# Patient Record
Sex: Male | Born: 1970 | Race: White | Hispanic: No | Marital: Married | Smoking: Never smoker
Health system: Southern US, Community
[De-identification: ages and names within clinical notes are randomized; demographics above are authoritative.]

## PROBLEM LIST (undated history)

## (undated) DIAGNOSIS — K219 Gastro-esophageal reflux disease without esophagitis: Secondary | ICD-10-CM

---

## 1998-07-06 HISTORY — PX: OTHER SURGICAL HISTORY: SHX169

## 1998-11-24 ENCOUNTER — Other Ambulatory Visit: Admission: RE | Admit: 1998-11-24 | Discharge: 1998-11-24 | Payer: Self-pay | Admitting: Urology

## 2000-09-13 ENCOUNTER — Ambulatory Visit (HOSPITAL_COMMUNITY): Admission: RE | Admit: 2000-09-13 | Discharge: 2000-09-13 | Payer: Self-pay | Admitting: *Deleted

## 2000-09-13 ENCOUNTER — Encounter (INDEPENDENT_AMBULATORY_CARE_PROVIDER_SITE_OTHER): Payer: Self-pay

## 2003-03-27 ENCOUNTER — Encounter: Payer: Self-pay | Admitting: Emergency Medicine

## 2003-03-27 ENCOUNTER — Emergency Department (HOSPITAL_COMMUNITY): Admission: EM | Admit: 2003-03-27 | Discharge: 2003-03-27 | Payer: Self-pay | Admitting: Emergency Medicine

## 2004-02-12 ENCOUNTER — Emergency Department (HOSPITAL_COMMUNITY): Admission: EM | Admit: 2004-02-12 | Discharge: 2004-02-12 | Payer: Self-pay | Admitting: Emergency Medicine

## 2008-06-07 ENCOUNTER — Ambulatory Visit: Payer: Self-pay | Admitting: Cardiology

## 2008-11-09 ENCOUNTER — Encounter: Admission: RE | Admit: 2008-11-09 | Discharge: 2008-11-09 | Payer: Self-pay | Admitting: Orthopedic Surgery

## 2010-08-06 HISTORY — PX: PLANTAR FASCIECTOMY: SUR600

## 2010-12-22 NOTE — Procedures (Signed)
[facility]  Patient:    Jeffrey Padilla, Jeffrey Padilla                    MRN: [id number] Proc. Date: 09/13/00 Adm. Date:  [id number] Attending:  Sabino Gasser CC:         Dr. Audie Pinto   Procedure Report  PROCEDURE:  Upper Endoscopy with Savary dilation and biopsy.  GASTROENTEROLOGIST:  Sabino Gasser, M.D.  INDICATIONS:  Dysphagia, reflux symptomatology.  ANESTHESIA:  Demerol 100 mg, Versed 10 mg.  PROCEDURE IN DETAIL:  With the patient mildly sedated and in the left lateral decubitus position, the Olympus video endoscope was inserted in the mouth, passed under direct vision through the esophagus which appeared inflamed distally, and there was a question of short segment Barretts esophagus.  We photographed this.  We entered into the stomach.  Fundus body appeared normal.  The antrum, however, was pock marked by multiple small punctate ulcerations which were photographed and subsequently biopsied.  We entered the pylorus and visualized the duodenum and second portion of the duodenum, both of which appeared normal and were photographed.  From this point, the endoscope was slowly withdrawn, taking circumferential views of entire duodenal mucosa until the endoscope had been pulled back into the stomach, placed in retroflexion to view the stomach from below.  The endoscope was then straightened, and a guidewire was passed.  The endoscope was removed subsequently over the guidewire.  Savary 18 and 20 dilators were passed with some resistance.  With the latter, the endoscope was then reinserted to the antrum and then slowly withdrawn, taking circumferential views of the entire gastric mucosa until we pulled back to the distal esophagus where we took a biopsy to look for the short segment Barretts esophagus.  The endoscope was then withdrawn, taking circumferential views of remaining esophageal mucosa.  The patients vital signs and pulse oximetry remained stable.   The patient tolerated the procedure well with no apparent complications.  FINDINGS:  Inflammatory changes of distal esophagus may be causing the patients dysphagia.  We dilated this to 18 and 20 Savary dilators. There was a question of short segment Barretts esophagus here which was biopsied, and multiple punctate ulcerations of the antrum which were also biopsied.  PLAN:  Continue patient on proton pump inhibitor therapy.  We will have him back for followup to assess clinical response.  The patient will all me for results of biopsy next week.  We will proceed to colonoscopy as planned. DD:  09/13/00 TD:  09/14/00 Job: [id number]

## 2010-12-22 NOTE — Procedures (Signed)
[facility]  Patient:    Jeffrey Padilla, Jeffrey Padilla                    MRN: [id number] Proc. Date: 09/13/00 Adm. Date:  [id number] Attending:  Sabino Gasser                           Procedure Report  PROCEDURE:  Colonoscopy.  SURGEON:  Sabino Gasser, M.D.  INDICATIONS:  Rectal bleeding, change in bowel habits with new onset of severe constipation.  ANESTHESIA:  None further given.  DESCRIPTION OF PROCEDURE:  With the patient mildly sedated in the left lateral decubitus position, and suddenly rolled to his back, first a rectal examination was performed which was unremarkable.  Subsequently, the Olympus videoscopic colonoscope was inserted in the rectum and passed under direct vision to the cecum.  The cecum was identified by the ileocecal valve and appendiceal orifice, both of which were photographed.  From this point, the colonoscope was slowly withdrawn, taking circumferential views of the entire colonic mucosa, stopping only in the rectum which appeared normal on direct view, and showed internal hemorrhoids on retroflexed view.  The endoscope was then straightened and withdrawn.  The patients vital signs and pulse oximeter remained stable.  The patient tolerated the procedure well and without apparent complications.  FINDINGS:  Internal hemorrhoids, otherwise unremarkable colonoscopic examination to the cecum.  PLAN:  No further evaluation necessary at this point.  Will increase the patients fiber intake and fluid intake to help control his constipation symptoms.  See endoscopy note for further details of followup. DD:  09/13/00 TD:  09/14/00 Job: [id number]

## 2010-12-22 NOTE — Procedures (Signed)
[facility]  Patient:    Jeffrey Padilla, Jeffrey Padilla                    MRN: [id number] Proc. Date: 09/13/00 Adm. Date:  [id number] Attending:  Sabino Gasser CC:         Dr. Audie Pinto   Procedure Report  ADDENDUN TO COLONOSCOPY REPORT: DD:  09/13/00 TD:  09/14/00 Job: [id number]

## 2011-06-01 ENCOUNTER — Emergency Department (HOSPITAL_COMMUNITY): Payer: PRIVATE HEALTH INSURANCE

## 2011-06-01 ENCOUNTER — Observation Stay (HOSPITAL_COMMUNITY): Payer: PRIVATE HEALTH INSURANCE

## 2011-06-01 ENCOUNTER — Encounter (HOSPITAL_COMMUNITY): Payer: Self-pay | Admitting: Radiology

## 2011-06-01 ENCOUNTER — Observation Stay (HOSPITAL_COMMUNITY)
Admission: EM | Admit: 2011-06-01 | Discharge: 2011-06-01 | Disposition: A | Discharge status: Home / Self Care | Payer: PRIVATE HEALTH INSURANCE | Attending: Emergency Medicine | Admitting: Emergency Medicine

## 2011-06-01 DIAGNOSIS — J189 Pneumonia, unspecified organism: Secondary | ICD-10-CM | POA: Insufficient documentation

## 2011-06-01 DIAGNOSIS — R942 Abnormal results of pulmonary function studies: Secondary | ICD-10-CM | POA: Insufficient documentation

## 2011-06-01 DIAGNOSIS — M545 Low back pain, unspecified: Principal | ICD-10-CM | POA: Insufficient documentation

## 2011-06-01 HISTORY — DX: Gastro-esophageal reflux disease without esophagitis: K21.9

## 2011-06-01 LAB — POCT I-STAT, CHEM 8
BUN: 12 mg/dL (ref 6–23)
Calcium, Ion: 1.14 mmol/L (ref 1.12–1.32)
Chloride: 105 mEq/L (ref 96–112)
Creatinine, Ser: 0.7 mg/dL (ref 0.50–1.35)
Glucose, Bld: 98 mg/dL (ref 70–99)
HCT: 47 % (ref 39.0–52.0)
Hemoglobin: 16 g/dL (ref 13.0–17.0)
Potassium: 3.4 mEq/L — ABNORMAL LOW (ref 3.5–5.1)
Sodium: 141 mEq/L (ref 135–145)
TCO2: 23 mmol/L (ref 0–100)

## 2011-06-01 LAB — URINALYSIS, ROUTINE W REFLEX MICROSCOPIC
Bilirubin Urine: NEGATIVE
Glucose, UA: NEGATIVE mg/dL
Hgb urine dipstick: NEGATIVE
Ketones, ur: NEGATIVE mg/dL
Leukocytes, UA: NEGATIVE
Nitrite: NEGATIVE
Protein, ur: NEGATIVE mg/dL
Specific Gravity, Urine: 1.02 (ref 1.005–1.030)
Urobilinogen, UA: 0.2 mg/dL (ref 0.0–1.0)
pH: 8 (ref 5.0–8.0)

## 2011-06-01 MED ORDER — IOHEXOL 350 MG/ML SOLN: 80.0000 mL | Freq: Once | INTRAVENOUS | Status: DC | PRN

## 2011-06-08 ENCOUNTER — Ambulatory Visit (INDEPENDENT_AMBULATORY_CARE_PROVIDER_SITE_OTHER): Payer: PRIVATE HEALTH INSURANCE | Admitting: General Surgery

## 2011-06-08 ENCOUNTER — Encounter (INDEPENDENT_AMBULATORY_CARE_PROVIDER_SITE_OTHER): Payer: Self-pay | Admitting: General Surgery

## 2011-06-08 VITALS — BP 180/90 | HR 72 | Temp 97.4°F | Resp 20 | Ht 72.0 in | Wt 296.0 lb

## 2011-06-08 DIAGNOSIS — R109 Unspecified abdominal pain: Secondary | ICD-10-CM

## 2011-06-08 NOTE — Progress Notes (Signed)
Subjective:   Right flank pain  Patient ID: Jeffrey Padilla, male   DOB: August 27, 1970, 40 y.o.   MRN: [id number]  HPI Patient is a 40 year old male referred from [facility] orthopedics by Dr. Andrey Farmer. He is here accompanied by his wife. Approximately 8-10 weeks ago he had the onset over a period of days of pain in his right flank. There was no recognizable injury or inciting factor. He states that he developed a pressure and cramping type of pain which he locates at his costal margin somewhat posterior to the midaxillary line. He states the pain gradually increased in intensity and has become intermittently quite severe. He has been evaluated several times by his PCP as well as orthopedics. He tried to remain active despite the pain and last week he made a sudden move to catch a heavy object and felt like the pressure-like pain "burst" but then the cramping pain became much more severe and intolerable. He presented to the emergency room at [facility]. Complete lab work and CT scan of the abdomen and pelvis as well as CT angiogram chest was performed. I have reviewed this study. It shows some possible scar or atelectasis in the right lung base and followup has been arranged for this. There was a small nonobstructing stone within the right kidney but no other abnormalities. Urinalysis and lab work were unremarkable.  The patient has been taking Percocet with some relief. The pain is worse with physical activity and improved with rest and lying on his back. He had some nausea with the Percocet but otherwise no GI symptoms, fever or chills, or any other associated symptoms. He was evaluated by orthopedics and an MRI was performed. I do not have the results of this as I was unable to open the disc but he was not felt to have a back problem and was referred for possible hernia or abdominal muscle tear. He states he has noted some swelling in the area of the pain but not a distinct bulge. He does not have any  history of any similar problems.  Past Medical History  Diagnosis Date  . Acid reflux   . Abdominal pain    Past Surgical History  Procedure Date  . 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 Diazepam (VALIUM PO) Take by mouth every 4 (four) hours as needed.        . Esomeprazole Magnesium (NEXIUM PO) Take by mouth daily.        . Oxycodone-Acetaminophen (PERCOCET PO) Take by mouth every 4 (four) hours as needed.        . Allergies  Allergen Reactions  . Morphine And Related   . Penicillins    History  Substance Use Topics  . Smoking status: Not on file  . Smokeless tobacco: Never Used  . Alcohol Use: No     Review of Systems  Constitutional: Negative for fever and chills.  Respiratory: Negative for cough, shortness of breath and wheezing.   Cardiovascular: Negative for chest pain and palpitations.  Gastrointestinal: Negative for nausea, vomiting, diarrhea, constipation and abdominal distention.  Genitourinary: Negative for dysuria, frequency and difficulty urinating.       Objective:   Physical Exam General: Obese but otherwise healthy appearing white male who is uncomfortable but in no acute distress HEENT: No palpable masses or thyromegaly. Sclera nonicteric. Skin: No rash or infection Lungs: Clear bilaterally with no increased work  of breathing Cardiovascular: Regular rate and rhythm without murmur no edema Abdomen: Soft and nontender with no palpable masses or organomegaly. There is not really any tenderness over the right posterior flank in the area of his pain. He feels there is swelling there I cannot detect any distinct swelling. It is certainly not feel any mass or bulge or any evidence of hernia. Extremities: No swelling Neurologic: Alert and fully oriented. Gait normal.    Assessment:     Persistent right posterior flank pain. The patient was referred for possible hernia.  This is somewhat in the area of a lumbar hernia which of course would be quite rare but I find no evidence of hernia by physical exam or imaging studies. I do not see any evidence of intra-abdominal source for his pain and clinically it seems to be musculoskeletal. I suspect this is most likely an abdominal or flank muscle strain or tear. I do not find any evidence of an acute surgical problem.    Plan:     I discussed with the patient and his wife and I did not see any surgical indication. He has had a thorough workup and I do not have any additional suggestions. I think by exclusion that an abdominal muscle injury but no hernia is most likely. Unfortunately have no other suggestions than rest and analgesics. Possibly some physical therapy could help and I suggested he asked orthopedics about this. I told him I would be happy to reevaluate him at any time if any other symptoms or findings develop.

## 2011-06-13 ENCOUNTER — Ambulatory Visit (INDEPENDENT_AMBULATORY_CARE_PROVIDER_SITE_OTHER): Payer: Self-pay | Admitting: General Surgery

## 2011-08-22 ENCOUNTER — Telehealth (INDEPENDENT_AMBULATORY_CARE_PROVIDER_SITE_OTHER): Payer: Self-pay

## 2011-09-06 NOTE — Telephone Encounter (Signed)
Patient appt 06/08/11 w/Dr. Johna Sheriff

## 2011-10-15 ENCOUNTER — Telehealth (INDEPENDENT_AMBULATORY_CARE_PROVIDER_SITE_OTHER): Payer: Self-pay

## 2011-10-15 NOTE — Telephone Encounter (Signed)
Error

## 2016-06-04 ENCOUNTER — Other Ambulatory Visit: Payer: Self-pay | Admitting: Occupational Medicine

## 2016-06-04 ENCOUNTER — Ambulatory Visit: Payer: Self-pay

## 2016-06-04 DIAGNOSIS — M79642 Pain in left hand: Secondary | ICD-10-CM

## 2017-03-11 NOTE — H&P (Signed)
Jeffrey Padilla comes in for his left heel.  This has gotten to a point of being completely intolerable.  This is the same side where he had a plantar fascial release and a gastroc resection done a number of years ago.  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  He recently had to take a new fitness test which involved walking and running on a treadmill at heights and this is really making his heel get aggravated.  He has altered his gait and he is now starting to get shin splints.  He comes in to discuss definitive treatment.  No new trauma.   History, workup and treatment to date are reviewed.   EXAMINATION: General exam is outlined and included in the chart.  Specifically, he has point tenderness distal Achilles with prominent spurring there, as well as over Haglund deformity.  No tenderness in the plantar fascia.  Because of his gastroc release his dorsiflexion is really quite good, more than 15 degrees even with the knee extended.  AP and lateral x-ray shows marked spurring and fragmentation at the Achilles attachment at the heel.  Haglund deformity.  There is a plantar fascia spur, but that is asymptomatic.    DISPOSITION:  We have discussed definitive treatment.  Nothing short of operative intervention is going to help and he understands that.  Taken down his Achilles and remove all of the spurs and loose bodies.  Take down the Haglund deformity and then primary repair of his Achilles.  Procedure, risks, benefits and complications reviewed.  Based on his job, out of work at least 12 weeks before he is sufficiently rehabbed and recovered.  It could be a little longer than that.  He understands that.  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  I filled out all of his  paperwork.  All questions answered.  See him at the time of operative intervention.

## 2017-03-14 ENCOUNTER — Encounter (HOSPITAL_BASED_OUTPATIENT_CLINIC_OR_DEPARTMENT_OTHER): Payer: Self-pay | Admitting: *Deleted

## 2017-03-21 ENCOUNTER — Encounter (HOSPITAL_BASED_OUTPATIENT_CLINIC_OR_DEPARTMENT_OTHER): Payer: Self-pay | Admitting: Certified Registered"

## 2017-03-21 ENCOUNTER — Encounter (HOSPITAL_BASED_OUTPATIENT_CLINIC_OR_DEPARTMENT_OTHER)
Admission: RE | Disposition: A | Discharge status: Home / Self Care | Payer: Self-pay | Source: Ambulatory Visit | Attending: Orthopedic Surgery

## 2017-03-21 ENCOUNTER — Ambulatory Visit (HOSPITAL_BASED_OUTPATIENT_CLINIC_OR_DEPARTMENT_OTHER)
Admission: RE | Admit: 2017-03-21 | Discharge: 2017-03-21 | Disposition: A | Discharge status: Home / Self Care | Payer: PRIVATE HEALTH INSURANCE | Source: Ambulatory Visit | Attending: Orthopedic Surgery | Admitting: Orthopedic Surgery

## 2017-03-21 ENCOUNTER — Ambulatory Visit (HOSPITAL_BASED_OUTPATIENT_CLINIC_OR_DEPARTMENT_OTHER): Payer: PRIVATE HEALTH INSURANCE | Admitting: Certified Registered"

## 2017-03-21 DIAGNOSIS — M9262 Juvenile osteochondrosis of tarsus, left ankle: Secondary | ICD-10-CM | POA: Diagnosis present

## 2017-03-21 DIAGNOSIS — Z79891 Long term (current) use of opiate analgesic: Secondary | ICD-10-CM | POA: Diagnosis not present

## 2017-03-21 DIAGNOSIS — M7660 Achilles tendinitis, unspecified leg: Secondary | ICD-10-CM | POA: Insufficient documentation

## 2017-03-21 DIAGNOSIS — Z79899 Other long term (current) drug therapy: Secondary | ICD-10-CM | POA: Diagnosis not present

## 2017-03-21 DIAGNOSIS — M65872 Other synovitis and tenosynovitis, left ankle and foot: Secondary | ICD-10-CM | POA: Diagnosis not present

## 2017-03-21 HISTORY — PX: EXCISION HAGLUND'S DEFORMITY WITH ACHILLES TENDON REPAIR: SHX5627

## 2017-03-21 SURGERY — EXCISION HAGLUND'S DEFORMITY WITH ACHILLES TENDON REPAIR
Anesthesia: General | Site: Foot | Laterality: Left

## 2017-03-21 MED ORDER — FENTANYL CITRATE (PF) 100 MCG/2ML IJ SOLN
50.0000 ug | INTRAMUSCULAR | Status: DC | PRN
  Administered 2017-03-21: 50 ug via INTRAVENOUS
  Administered 2017-03-21: 100 ug via INTRAVENOUS

## 2017-03-21 MED ORDER — ONDANSETRON HCL 4 MG/2ML IJ SOLN
INTRAMUSCULAR | Status: DC | PRN
  Administered 2017-03-21: 4 mg via INTRAVENOUS

## 2017-03-21 MED ORDER — LACTATED RINGERS IV SOLN
INTRAVENOUS | Status: DC
  Administered 2017-03-21 (×2): via INTRAVENOUS

## 2017-03-21 MED ORDER — PROPOFOL 10 MG/ML IV BOLUS
INTRAVENOUS | Status: AC
  Filled 2017-03-21: qty 20

## 2017-03-21 MED ORDER — PROPOFOL 10 MG/ML IV BOLUS
INTRAVENOUS | Status: DC | PRN
  Administered 2017-03-21: 200 mg via INTRAVENOUS

## 2017-03-21 MED ORDER — GABAPENTIN 300 MG PO CAPS
300.0000 mg | ORAL_CAPSULE | Freq: Once | ORAL | Status: AC
  Administered 2017-03-21: 300 mg via ORAL

## 2017-03-21 MED ORDER — PROMETHAZINE HCL 25 MG/ML IJ SOLN: 6.2500 mg | INTRAMUSCULAR | Status: DC | PRN

## 2017-03-21 MED ORDER — ONDANSETRON HCL 4 MG PO TABS: 4.0000 mg | ORAL_TABLET | Freq: Three times a day (TID) | ORAL | 0 refills | Status: DC | PRN

## 2017-03-21 MED ORDER — CELECOXIB 400 MG PO CAPS
400.0000 mg | ORAL_CAPSULE | Freq: Once | ORAL | Status: AC
  Administered 2017-03-21: 400 mg via ORAL

## 2017-03-21 MED ORDER — VANCOMYCIN HCL IN DEXTROSE 1-5 GM/200ML-% IV SOLN
1000.0000 mg | INTRAVENOUS | Status: AC
  Administered 2017-03-21: 1000 mg via INTRAVENOUS

## 2017-03-21 MED ORDER — ONDANSETRON HCL 4 MG PO TABS: 4.0000 mg | ORAL_TABLET | Freq: Four times a day (QID) | ORAL | Status: DC | PRN

## 2017-03-21 MED ORDER — GABAPENTIN 300 MG PO CAPS
ORAL_CAPSULE | ORAL | Status: AC
  Filled 2017-03-21: qty 1

## 2017-03-21 MED ORDER — LACTATED RINGERS IV SOLN: INTRAVENOUS | Status: DC

## 2017-03-21 MED ORDER — MIDAZOLAM HCL 2 MG/2ML IJ SOLN
1.0000 mg | INTRAMUSCULAR | Status: DC | PRN
  Administered 2017-03-21: 2 mg via INTRAVENOUS

## 2017-03-21 MED ORDER — FENTANYL CITRATE (PF) 100 MCG/2ML IJ SOLN: 25.0000 ug | INTRAMUSCULAR | Status: DC | PRN

## 2017-03-21 MED ORDER — ACETAMINOPHEN 500 MG PO TABS
1000.0000 mg | ORAL_TABLET | Freq: Once | ORAL | Status: AC
  Administered 2017-03-21: 1000 mg via ORAL

## 2017-03-21 MED ORDER — OXYCODONE-ACETAMINOPHEN 5-325 MG PO TABS: 1.0000 | ORAL_TABLET | ORAL | 0 refills | Status: DC | PRN

## 2017-03-21 MED ORDER — 0.9 % SODIUM CHLORIDE (POUR BTL) OPTIME
TOPICAL | Status: DC | PRN
  Administered 2017-03-21: 250 mL

## 2017-03-21 MED ORDER — SUCCINYLCHOLINE CHLORIDE 20 MG/ML IJ SOLN
INTRAMUSCULAR | Status: DC | PRN
  Administered 2017-03-21: 120 mg via INTRAVENOUS

## 2017-03-21 MED ORDER — FENTANYL CITRATE (PF) 100 MCG/2ML IJ SOLN
INTRAMUSCULAR | Status: AC
  Filled 2017-03-21: qty 2

## 2017-03-21 MED ORDER — CELECOXIB 200 MG PO CAPS
ORAL_CAPSULE | ORAL | Status: AC
  Filled 2017-03-21: qty 2

## 2017-03-21 MED ORDER — ONDANSETRON HCL 4 MG/2ML IJ SOLN
INTRAMUSCULAR | Status: AC
  Filled 2017-03-21: qty 18

## 2017-03-21 MED ORDER — OXYCODONE-ACETAMINOPHEN 5-325 MG PO TABS: 1.0000 | ORAL_TABLET | ORAL | Status: DC | PRN

## 2017-03-21 MED ORDER — METOCLOPRAMIDE HCL 5 MG PO TABS: 5.0000 mg | ORAL_TABLET | Freq: Three times a day (TID) | ORAL | Status: DC | PRN

## 2017-03-21 MED ORDER — BUPIVACAINE-EPINEPHRINE (PF) 0.5% -1:200000 IJ SOLN
INTRAMUSCULAR | Status: AC
  Filled 2017-03-21: qty 30

## 2017-03-21 MED ORDER — DEXAMETHASONE SODIUM PHOSPHATE 10 MG/ML IJ SOLN
INTRAMUSCULAR | Status: AC
  Filled 2017-03-21: qty 4

## 2017-03-21 MED ORDER — LIDOCAINE HCL (CARDIAC) 20 MG/ML IV SOLN
INTRAVENOUS | Status: DC | PRN
  Administered 2017-03-21: 30 mg via INTRAVENOUS

## 2017-03-21 MED ORDER — LIDOCAINE 2% (20 MG/ML) 5 ML SYRINGE
INTRAMUSCULAR | Status: AC
  Filled 2017-03-21: qty 25

## 2017-03-21 MED ORDER — DEXAMETHASONE SODIUM PHOSPHATE 4 MG/ML IJ SOLN
INTRAMUSCULAR | Status: DC | PRN
  Administered 2017-03-21: 10 mg via INTRAVENOUS

## 2017-03-21 MED ORDER — ACETAMINOPHEN 500 MG PO TABS
ORAL_TABLET | ORAL | Status: AC
  Filled 2017-03-21: qty 2

## 2017-03-21 MED ORDER — BUPIVACAINE-EPINEPHRINE (PF) 0.5% -1:200000 IJ SOLN
INTRAMUSCULAR | Status: DC | PRN
  Administered 2017-03-21: 30 mL via PERINEURAL

## 2017-03-21 MED ORDER — MIDAZOLAM HCL 2 MG/2ML IJ SOLN
INTRAMUSCULAR | Status: AC
  Filled 2017-03-21: qty 2

## 2017-03-21 MED ORDER — CHLORHEXIDINE GLUCONATE 4 % EX LIQD: 60.0000 mL | Freq: Once | CUTANEOUS | Status: DC

## 2017-03-21 MED ORDER — SCOPOLAMINE 1 MG/3DAYS TD PT72: 1.0000 | MEDICATED_PATCH | Freq: Once | TRANSDERMAL | Status: DC | PRN

## 2017-03-21 MED ORDER — HYDROMORPHONE HCL 1 MG/ML IJ SOLN: 0.5000 mg | INTRAMUSCULAR | Status: DC | PRN

## 2017-03-21 MED ORDER — ONDANSETRON HCL 4 MG/2ML IJ SOLN: 4.0000 mg | Freq: Four times a day (QID) | INTRAMUSCULAR | Status: DC | PRN

## 2017-03-21 MED ORDER — VANCOMYCIN HCL IN DEXTROSE 1-5 GM/200ML-% IV SOLN
INTRAVENOUS | Status: AC
  Filled 2017-03-21: qty 400

## 2017-03-21 MED ORDER — PROPOFOL 500 MG/50ML IV EMUL
INTRAVENOUS | Status: AC
  Filled 2017-03-21: qty 150

## 2017-03-21 MED ORDER — CELECOXIB 200 MG PO CAPS: 200.0000 mg | ORAL_CAPSULE | Freq: Once | ORAL | Status: DC

## 2017-03-21 MED ORDER — METOCLOPRAMIDE HCL 5 MG/ML IJ SOLN: 5.0000 mg | Freq: Three times a day (TID) | INTRAMUSCULAR | Status: DC | PRN

## 2017-03-21 SURGICAL SUPPLY — 80 items
BANDAGE ACE 4X5 VEL STRL LF (GAUZE/BANDAGES/DRESSINGS) ×4 IMPLANT
BANDAGE ACE 6X5 VEL STRL LF (GAUZE/BANDAGES/DRESSINGS) ×4 IMPLANT
BANDAGE ESMARK 6X9 LF (GAUZE/BANDAGES/DRESSINGS) ×2 IMPLANT
BLADE AVERAGE 25MMX9MM (BLADE) ×1
BLADE AVERAGE 25X9 (BLADE) ×3 IMPLANT
BLADE MICRO SAGITTAL (BLADE) IMPLANT
BLADE SURG 15 STRL LF DISP TIS (BLADE) ×4 IMPLANT
BLADE SURG 15 STRL SS (BLADE) ×4
BNDG CMPR 9X6 STRL LF SNTH (GAUZE/BANDAGES/DRESSINGS) ×1
BNDG COHESIVE 4X5 TAN STRL (GAUZE/BANDAGES/DRESSINGS) ×4 IMPLANT
BNDG ESMARK 6X9 LF (GAUZE/BANDAGES/DRESSINGS) ×4
CANISTER SUCT 1200ML W/VALVE (MISCELLANEOUS) ×4 IMPLANT
COVER BACK TABLE 60X90IN (DRAPES) ×4 IMPLANT
CUFF TOURNIQUET SINGLE 34IN LL (TOURNIQUET CUFF) ×4 IMPLANT
CUFF TOURNIQUET SINGLE 44IN (TOURNIQUET CUFF) IMPLANT
DRAPE EXTREMITY T 121X128X90 (DRAPE) ×4 IMPLANT
DRAPE IMP U-DRAPE 54X76 (DRAPES) ×4 IMPLANT
DRAPE OEC MINIVIEW 54X84 (DRAPES) ×4 IMPLANT
DRAPE U-SHAPE 47X51 STRL (DRAPES) ×4 IMPLANT
DRSG PAD ABDOMINAL 8X10 ST (GAUZE/BANDAGES/DRESSINGS) ×4 IMPLANT
DURAPREP 26ML APPLICATOR (WOUND CARE) ×4 IMPLANT
ELECT REM PT RETURN 9FT ADLT (ELECTROSURGICAL) ×4
ELECTRODE REM PT RTRN 9FT ADLT (ELECTROSURGICAL) ×2 IMPLANT
GAUZE SPONGE 4X4 12PLY STRL (GAUZE/BANDAGES/DRESSINGS) ×4 IMPLANT
GAUZE XEROFORM 1X8 LF (GAUZE/BANDAGES/DRESSINGS) ×4 IMPLANT
GLOVE BIO SURGEON STRL SZ 6.5 (GLOVE) ×3 IMPLANT
GLOVE BIO SURGEONS STRL SZ 6.5 (GLOVE) ×1
GLOVE BIOGEL PI IND STRL 7.0 (GLOVE) ×6 IMPLANT
GLOVE BIOGEL PI INDICATOR 7.0 (GLOVE) ×6
GLOVE ECLIPSE 7.0 STRL STRAW (GLOVE) ×4 IMPLANT
GLOVE SURG ORTHO 8.0 STRL STRW (GLOVE) ×4 IMPLANT
GOWN STRL REUS W/ TWL LRG LVL3 (GOWN DISPOSABLE) ×2 IMPLANT
GOWN STRL REUS W/ TWL XL LVL3 (GOWN DISPOSABLE) ×4 IMPLANT
GOWN STRL REUS W/TWL LRG LVL3 (GOWN DISPOSABLE) ×3
GOWN STRL REUS W/TWL XL LVL3 (GOWN DISPOSABLE) ×8 IMPLANT
IMPL SYS BIOCOMP ACH SPEED (Anchor) ×2 IMPLANT
IMPLANT SYS BIOCOMP ACH SPEED (Anchor) ×4 IMPLANT
NDL SUT 6 .5 CRC .975X.05 MAYO (NEEDLE) ×2 IMPLANT
NEEDLE 1/2 CIR CATGUT .05X1.09 (NEEDLE) IMPLANT
NEEDLE HYPO 22GX1.5 SAFETY (NEEDLE) IMPLANT
NEEDLE MAYO TAPER (NEEDLE) ×2
PACK BASIN DAY SURGERY FS (CUSTOM PROCEDURE TRAY) ×4 IMPLANT
PAD CAST 4YDX4 CTTN HI CHSV (CAST SUPPLIES) ×4 IMPLANT
PADDING CAST ABS 4INX4YD NS (CAST SUPPLIES) ×6
PADDING CAST ABS COTTON 4X4 ST (CAST SUPPLIES) ×6 IMPLANT
PADDING CAST COTTON 4X4 STRL (CAST SUPPLIES) ×4
PADDING CAST COTTON 6X4 STRL (CAST SUPPLIES) ×4 IMPLANT
PENCIL BUTTON HOLSTER BLD 10FT (ELECTRODE) ×4 IMPLANT
SLEEVE SCD COMPRESS KNEE MED (MISCELLANEOUS) ×4 IMPLANT
SPLINT FAST PLASTER 5X30 (CAST SUPPLIES) ×40
SPLINT FIBERGLASS 4X30 (CAST SUPPLIES) IMPLANT
SPLINT PLASTER CAST FAST 5X30 (CAST SUPPLIES) ×40 IMPLANT
SPONGE LAP 4X18 X RAY DECT (DISPOSABLE) ×4 IMPLANT
STAPLER VISISTAT 35W (STAPLE) IMPLANT
STOCKINETTE 6  STRL (DRAPES) ×2
STOCKINETTE 6 STRL (DRAPES) ×2 IMPLANT
SUCTION FRAZIER HANDLE 10FR (MISCELLANEOUS) ×2
SUCTION TUBE FRAZIER 10FR DISP (MISCELLANEOUS) ×2 IMPLANT
SUT ETHILON 2 0 FS 18 (SUTURE) IMPLANT
SUT ETHILON 3 0 PS 1 (SUTURE) IMPLANT
SUT FIBERWIRE #2 38 T-5 BLUE (SUTURE)
SUT VIC AB 0 CT1 27 (SUTURE)
SUT VIC AB 0 CT1 27XBRD ANBCTR (SUTURE) IMPLANT
SUT VIC AB 1 CT1 27 (SUTURE)
SUT VIC AB 1 CT1 27XBRD ANBCTR (SUTURE) IMPLANT
SUT VIC AB 2-0 CT1 27 (SUTURE) ×6
SUT VIC AB 2-0 CT1 TAPERPNT 27 (SUTURE) ×4 IMPLANT
SUT VIC AB 2-0 SH 27 (SUTURE)
SUT VIC AB 2-0 SH 27XBRD (SUTURE) IMPLANT
SUT VIC AB 3-0 SH 27 (SUTURE)
SUT VIC AB 3-0 SH 27X BRD (SUTURE) IMPLANT
SUTURE FIBERWR #2 38 T-5 BLUE (SUTURE) IMPLANT
SYR BULB 3OZ (MISCELLANEOUS) ×4 IMPLANT
SYR CONTROL 10ML LL (SYRINGE) IMPLANT
TOWEL OR 17X24 6PK STRL BLUE (TOWEL DISPOSABLE) ×8 IMPLANT
TOWEL OR NON WOVEN STRL DISP B (DISPOSABLE) ×8 IMPLANT
TUBE CONNECTING 20'X1/4 (TUBING) ×1
TUBE CONNECTING 20X1/4 (TUBING) ×3 IMPLANT
UNDERPAD 30X30 (UNDERPADS AND DIAPERS) ×4 IMPLANT
YANKAUER SUCT BULB TIP NO VENT (SUCTIONS) IMPLANT

## 2017-03-21 NOTE — Transfer of Care (Signed)
Immediate Anesthesia Transfer of Care Note  Patient: Jeffrey FieldJeffery F Padilla  Procedure(s) Performed: Procedure(s): LEFT FOOT PARTIAL CALCANEUS EXCISION , LEFT ACHILLES TENDON REPAIR (Left)  Patient Location: PACU  Anesthesia Type:GA combined with regional for post-op pain  Level of Consciousness: awake and patient cooperative  Airway & Oxygen Therapy: Patient Spontanous Breathing and Patient connected to face mask oxygen  Post-op Assessment: Report given to RN and Post -op Vital signs reviewed and stable  Post vital signs: Reviewed and stable  Last Vitals:  Vitals:   03/21/17 0840 03/21/17 0845  BP:    Pulse: 74 69  Resp: 16 18  Temp:    SpO2: 98% 98%    Last Pain:  Vitals:   03/21/17 0757  TempSrc: Oral  PainSc: 5       Patients Stated Pain Goal: 2 (03/21/17 0757)  Complications: No apparent anesthesia complications

## 2017-03-21 NOTE — Anesthesia Procedure Notes (Signed)
Anesthesia Regional Block: Popliteal block   Pre-Anesthetic Checklist: ,, timeout performed, Correct Patient, Correct Site, Correct Laterality, Correct Procedure, Correct Position, site marked, Risks and benefits discussed,  Surgical consent,  Pre-op evaluation,  At surgeon's request and post-op pain management  Laterality: Left  Prep: chloraprep       Needles:  Injection technique: Single-shot  Needle Type: Echogenic Needle     Needle Length: 9cm  Needle Gauge: 21     Additional Needles:   Procedures: ultrasound guided,,,,,,,,  Narrative:  Start time: 03/21/2017 8:15 AM End time: 03/21/2017 8:25 AM Injection made incrementally with aspirations every 5 mL.  Performed by: Personally  Anesthesiologist: Cecile HearingURK, Jaylene Arrowood EDWARD  Additional Notes: No pain on injection. No increased resistance to injection. Injection made in 5cc increments.  Good needle visualization.  Patient tolerated procedure well.  Combined left popliteal/saphenous nerve block.

## 2017-03-21 NOTE — Anesthesia Postprocedure Evaluation (Signed)
Anesthesia Post Note  Patient: Delford FieldJeffery F Speyer  Procedure(s) Performed: Procedure(s) (LRB): LEFT FOOT PARTIAL CALCANEUS EXCISION , LEFT ACHILLES TENDON REPAIR (Left)     Patient location during evaluation: PACU Anesthesia Type: General Level of consciousness: awake and alert Pain management: pain level controlled Vital Signs Assessment: post-procedure vital signs reviewed and stable Respiratory status: spontaneous breathing, nonlabored ventilation and respiratory function stable Cardiovascular status: blood pressure returned to baseline and stable Postop Assessment: no signs of nausea or vomiting Anesthetic complications: no    Last Vitals:  Vitals:   03/21/17 1100 03/21/17 1125  BP: (!) 141/90 138/78  Pulse: 72 65  Resp: 16 18  Temp:  (!) 36.4 C  SpO2: 98% 99%    Last Pain:  Vitals:   03/21/17 1125  TempSrc:   PainSc: 0-No pain                 Cecile HearingStephen Edward Adael Culbreath

## 2017-03-21 NOTE — Interval H&P Note (Signed)
History and Physical Interval Note:  03/21/2017 7:37 AM  Jeffrey Padilla  has presented today for surgery, with the diagnosis of CALCANEAL SPUR, LEFT FOOT SPONTANEOUS RUPTURE OF FLEXOR TENDONS, LEFT LOWER LEG  The various methods of treatment have been discussed with the patient and family. After consideration of risks, benefits and other options for treatment, the patient has consented to  Procedure(s) with comments: LEFT FOOT PARTIAL CALCANEUS EXCISION , LEFT ACHILLES TENDON REPAIR (Left) - BLOCK/PRONE POSITION LEFT FOOT PARTIAL CALCANEUS EXCISION (Left) as a surgical intervention .  The patient's history has been reviewed, patient examined, no change in status, stable for surgery.  I have reviewed the patient's chart and labs.  Questions were answered to the patient's satisfaction.     Jeffrey Padilla

## 2017-03-21 NOTE — Anesthesia Preprocedure Evaluation (Addendum)
Anesthesia Evaluation  Patient identified by MRN, date of birth, ID band Patient awake    Reviewed: Allergy & Precautions, NPO status , Patient's Chart, lab work & pertinent test results  Airway Mallampati: II  TM Distance: >3 FB Neck ROM: Full    Dental  (+) Teeth Intact, Dental Advisory Given   Pulmonary neg pulmonary ROS,    Pulmonary exam normal breath sounds clear to auscultation       Cardiovascular negative cardio ROS Normal cardiovascular exam Rhythm:Regular Rate:Normal     Neuro/Psych negative neurological ROS  negative psych ROS   GI/Hepatic Neg liver ROS, GERD  Medicated,  Endo/Other  Morbid obesity  Renal/GU negative Renal ROS     Musculoskeletal negative musculoskeletal ROS (+)   Abdominal   Peds  Hematology negative hematology ROS (+)   Anesthesia Other Findings Day of surgery medications reviewed with the patient.  Reproductive/Obstetrics                            Anesthesia Physical Anesthesia Plan  ASA: III  Anesthesia Plan: General   Post-op Pain Management:  Regional for Post-op pain   Induction: Intravenous  PONV Risk Score and Plan: 2 and Ondansetron and Dexamethasone  Airway Management Planned: Oral ETT  Additional Equipment:   Intra-op Plan:   Post-operative Plan: Extubation in OR  Informed Consent: I have reviewed the patients History and Physical, chart, labs and discussed the procedure including the risks, benefits and alternatives for the proposed anesthesia with the patient or authorized representative who has indicated his/her understanding and acceptance.   Dental advisory given  Plan Discussed with: CRNA  Anesthesia Plan Comments: (Risks/benefits of general anesthesia discussed with patient including risk of damage to teeth, lips, gum, and tongue, nausea/vomiting, allergic reactions to medications, and the possibility of heart attack, stroke  and death.  All patient questions answered.  Patient wishes to proceed.)       Anesthesia Quick Evaluation

## 2017-03-21 NOTE — Discharge Instructions (Signed)
Care After Refer to this sheet in the next few weeks. These discharge instructions provide you with general information on caring for yourself after you leave the hospital. Your caregiver may also give you specific instructions. Your treatment has been planned according to the most current medical practices available, but unavoidable complications sometimes occur. If you have any problems or questions after discharge, please call your caregiver. HOME INSTRUCTIONS You may resume a normal diet and activities as directed. Walk with crutches NON WEIGHT BEARING Do NOT get cast wet.  Do NOT remove dressing until you come back to the doctor Only take over-the-counter or prescription medicines for pain, discomfort, or fever as directed by your caregiver.  Eat a well-balanced diet.  Avoid lifting or driving until you are instructed otherwise.  Make an appointment to see your caregiver for stitches (suture) or staple removal as directed.   SEEK MEDICAL CARE IF: You have swelling of your calf or leg.  You develop shortness of breath or chest pain.  You have redness, swelling, or increasing pain in the wound.  There is pus or any unusual drainage coming from the surgical site.  You notice a bad smell coming from the surgical site or dressing.  The surgical site breaks open after sutures or staples have been removed.  There is persistent bleeding from the suture or staple line.  You are getting worse or are not improving.  You have any other questions or concerns.  SEEK IMMEDIATE MEDICAL CARE IF:  You have a fever.  You develop a rash.  You have difficulty breathing.  You develop any reaction or side effects to medicines given.  Your knee motion is decreasing rather than improving.  MAKE SURE YOU:  Understand these instructions.  Will watch your condition.  Will get help right away if you are not doing well or get worse.     Post Anesthesia Home Care Instructions  Activity: Get plenty of  rest for the remainder of the day. A responsible individual must stay with you for 24 hours following the procedure.  For the next 24 hours, DO NOT: -Drive a car -Advertising copywriter -Drink alcoholic beverages -Take any medication unless instructed by your physician -Make any legal decisions or sign important papers.  Meals: Start with liquid foods such as gelatin or soup. Progress to regular foods as tolerated. Avoid greasy, spicy, heavy foods. If nausea and/or vomiting occur, drink only clear liquids until the nausea and/or vomiting subsides. Call your physician if vomiting continues.  Special Instructions/Symptoms: Your throat may feel dry or sore from the anesthesia or the breathing tube placed in your throat during surgery. If this causes discomfort, gargle with warm salt water. The discomfort should disappear within 24 hours.  If you had a scopolamine patch placed behind your ear for the management of post- operative nausea and/or vomiting:  1. The medication in the patch is effective for 72 hours, after which it should be removed.  Wrap patch in a tissue and discard in the trash. Wash hands thoroughly with soap and water. 2. You may remove the patch earlier than 72 hours if you experience unpleasant side effects which may include dry mouth, dizziness or visual disturbances. 3. Avoid touching the patch. Wash your hands with soap and water after contact with the patch.     Regional Anesthesia Blocks  1. Numbness or the inability to move the "blocked" extremity may last from 3-48 hours after placement. The length of time depends on the medication injected  and your individual response to the medication. If the numbness is not going away after 48 hours, call your surgeon.  2. The extremity that is blocked will need to be protected until the numbness is gone and the  Strength has returned. Because you cannot feel it, you will need to take extra care to avoid injury. Because it may be weak,  you may have difficulty moving it or using it. You may not know what position it is in without looking at it while the block is in effect.  3. For blocks in the legs and feet, returning to weight bearing and walking needs to be done carefully. You will need to wait until the numbness is entirely gone and the strength has returned. You should be able to move your leg and foot normally before you try and bear weight or walk. You will need someone to be with you when you first try to ensure you do not fall and possibly risk injury.  4. Bruising and tenderness at the needle site are common side effects and will resolve in a few days.  5. Persistent numbness or new problems with movement should be communicated to the surgeon or the [facility] [phone])/ [facility] [phone]).

## 2017-03-21 NOTE — Anesthesia Procedure Notes (Signed)
Procedure Name: Intubation Date/Time: 03/21/2017 9:09 AM Performed by: Toran Murch D Pre-anesthesia Checklist: Patient identified, Emergency Drugs available, Suction available and Patient being monitored Patient Re-evaluated:Patient Re-evaluated prior to induction Oxygen Delivery Method: Circle system utilized Preoxygenation: Pre-oxygenation with 100% oxygen Induction Type: IV induction Ventilation: Mask ventilation without difficulty Laryngoscope Size: Mac and 3 Grade View: Grade I Tube type: Oral Tube size: 7.0 mm Number of attempts: 1 Airway Equipment and Method: Stylet and Oral airway Placement Confirmation: ETT inserted through vocal cords under direct vision,  positive ETCO2 and breath sounds checked- equal and bilateral Secured at: 22 cm Tube secured with: Tape Dental Injury: Teeth and Oropharynx as per pre-operative assessment

## 2017-03-21 NOTE — Progress Notes (Signed)
Assisted Dr. Turk with left, ultrasound guided, popliteal/saphenous, adductor canal block. Side rails up, monitors on throughout procedure. See vital signs in flow sheet. Tolerated Procedure well. 

## 2017-03-22 ENCOUNTER — Encounter (HOSPITAL_BASED_OUTPATIENT_CLINIC_OR_DEPARTMENT_OTHER): Payer: Self-pay | Admitting: Orthopedic Surgery

## 2017-03-22 NOTE — Op Note (Signed)
NAME:  Jeffrey Padilla, Jeffrey Padilla NO.:  [id number]  MEDICAL RECORD NO.:  [id number]  LOCATION:                                 FACILITY:  PHYSICIAN:  Loreta Ave, M.D.      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  Loose fragments up in the tendon.  PROCEDURE:  Left heel aspiration.  Debridement and mobilization distal Achilles, removing all loose bone spurs.  Resection pre Achilles bursa. Removal of Haglund's deformity and attachment spurs.  Repair of Achilles utilizing Arthrex speed bridge technique.  SURGEON:  Loreta Ave, M.D.  ASSISTANT:  Mikey Kirschner, PA, present throughout the entire case and necessary for timely completion of procedure.  ANESTHESIA:  General.  BLOOD LOSS:  Minimal.  SPECIMENS:  None.  CULTURES:  None.  COMPLICATIONS:  None.  DRESSINGS:  Sterile compressive, short leg splint.  TOURNIQUET TIME:  40 minutes.  DESCRIPTION OF PROCEDURE:  The patient was brought to the operating room and after adequate anesthesia had been obtained, tourniquet applied. Placed in a prone position with appropriate padding and support. Prepped and draped in usual sterile fashion.  Exsanguinated with an elevation of Esmarch.  Tourniquet inflated to 250 mmHg.  Longitudinal incision in the lateral border of the Achilles over to the back of the os calcis.  Skin and subcutaneous tissue divided.  Marked tendinopathy, partial tearing, the entire distal attached to the Achilles.  This was taken down distally,  retracted proximally, debrided back to healthy tissue.  Removal of all loose bony fragments within the tendon.  The Haglund's deformity was then excised, contoured smoothly.  Pre Achilles bursa excised.  Fluoroscopic guidance.  I then made 4 drill holes in the os calcis after thorough irrigation.  Sutures were passed for speed bridge technique, passed through the tendon all over across and then anchored distally.  At completion, I had a nice firm reattachment confirmed visually.  Adequacy of bone excision confirmed fluoroscopically.  I can get him plantigrade with the knee flexed, it was not too tight.  Wound irrigated.  Closed with Vicryl and nylon. Sterile compressive dressing applied.  Short-leg splint applied. Tourniquet deflated and removed.  Returned to supine position. Anesthesia reversed.  Brought to the recovery room.  Tolerated the surgery well.  No complications.     Loreta Ave, M.D.   ______________________________ Loreta Ave, M.D.    DFM/MEDQ  D:  03/21/2017  T:  03/21/2017  Job:  [id number]

## 2017-03-22 NOTE — Addendum Note (Signed)
Addendum  created 03/22/17 1217 by Lance Coon, CRNA   Charge Capture section accepted

## 2017-06-10 ENCOUNTER — Encounter (HOSPITAL_BASED_OUTPATIENT_CLINIC_OR_DEPARTMENT_OTHER): Payer: Self-pay | Admitting: *Deleted

## 2017-06-11 ENCOUNTER — Encounter (HOSPITAL_BASED_OUTPATIENT_CLINIC_OR_DEPARTMENT_OTHER): Payer: Self-pay | Admitting: *Deleted

## 2017-06-11 ENCOUNTER — Ambulatory Visit (HOSPITAL_BASED_OUTPATIENT_CLINIC_OR_DEPARTMENT_OTHER)
Admission: RE | Admit: 2017-06-11 | Discharge: 2017-06-12 | Disposition: A | Discharge status: Home / Self Care | Payer: PRIVATE HEALTH INSURANCE | Source: Ambulatory Visit | Attending: Orthopedic Surgery | Admitting: Orthopedic Surgery

## 2017-06-11 ENCOUNTER — Other Ambulatory Visit: Payer: Self-pay

## 2017-06-11 ENCOUNTER — Ambulatory Visit (HOSPITAL_BASED_OUTPATIENT_CLINIC_OR_DEPARTMENT_OTHER): Payer: PRIVATE HEALTH INSURANCE | Admitting: Anesthesiology

## 2017-06-11 ENCOUNTER — Encounter (HOSPITAL_BASED_OUTPATIENT_CLINIC_OR_DEPARTMENT_OTHER)
Admission: RE | Disposition: A | Discharge status: Home / Self Care | Payer: Self-pay | Source: Ambulatory Visit | Attending: Orthopedic Surgery

## 2017-06-11 DIAGNOSIS — Z79899 Other long term (current) drug therapy: Secondary | ICD-10-CM | POA: Diagnosis not present

## 2017-06-11 DIAGNOSIS — X58XXXA Exposure to other specified factors, initial encounter: Secondary | ICD-10-CM | POA: Insufficient documentation

## 2017-06-11 DIAGNOSIS — Z880 Allergy status to penicillin: Secondary | ICD-10-CM | POA: Diagnosis not present

## 2017-06-11 DIAGNOSIS — L02612 Cutaneous abscess of left foot: Secondary | ICD-10-CM | POA: Diagnosis not present

## 2017-06-11 DIAGNOSIS — Z6841 Body Mass Index (BMI) 40.0 and over, adult: Secondary | ICD-10-CM | POA: Diagnosis not present

## 2017-06-11 DIAGNOSIS — L089 Local infection of the skin and subcutaneous tissue, unspecified: Secondary | ICD-10-CM

## 2017-06-11 DIAGNOSIS — Z885 Allergy status to narcotic agent status: Secondary | ICD-10-CM | POA: Insufficient documentation

## 2017-06-11 DIAGNOSIS — T8141XA Infection following a procedure, superficial incisional surgical site, initial encounter: Secondary | ICD-10-CM | POA: Insufficient documentation

## 2017-06-11 DIAGNOSIS — K219 Gastro-esophageal reflux disease without esophagitis: Secondary | ICD-10-CM | POA: Diagnosis not present

## 2017-06-11 HISTORY — PX: INCISION AND DRAINAGE: SHX5863

## 2017-06-11 SURGERY — INCISION AND DRAINAGE
Anesthesia: General | Site: Foot | Laterality: Left

## 2017-06-11 MED ORDER — SENNA 8.6 MG PO TABS
1.0000 | ORAL_TABLET | Freq: Two times a day (BID) | ORAL | Status: DC
  Administered 2017-06-11: 8.6 mg via ORAL
  Filled 2017-06-11: qty 1

## 2017-06-11 MED ORDER — DOCUSATE SODIUM 100 MG PO CAPS
100.0000 mg | ORAL_CAPSULE | Freq: Two times a day (BID) | ORAL | Status: DC
  Administered 2017-06-11: 100 mg via ORAL
  Filled 2017-06-11: qty 1

## 2017-06-11 MED ORDER — ENOXAPARIN SODIUM 40 MG/0.4ML ~~LOC~~ SOLN
40.0000 mg | SUBCUTANEOUS | Status: DC
Start: 2017-06-11 — End: 2017-06-12
  Administered 2017-06-11: 40 mg via SUBCUTANEOUS
  Filled 2017-06-11: qty 0.4

## 2017-06-11 MED ORDER — ONDANSETRON HCL 4 MG PO TABS: 4.0000 mg | ORAL_TABLET | Freq: Four times a day (QID) | ORAL | Status: DC | PRN

## 2017-06-11 MED ORDER — LACTATED RINGERS IV SOLN
INTRAVENOUS | Status: DC
  Administered 2017-06-11 (×2): via INTRAVENOUS

## 2017-06-11 MED ORDER — OXYCODONE-ACETAMINOPHEN 5-325 MG PO TABS: 1.0000 | ORAL_TABLET | ORAL | 0 refills | Status: DC | PRN

## 2017-06-11 MED ORDER — OXYCODONE HCL 5 MG/5ML PO SOLN: 5.0000 mg | Freq: Once | ORAL | Status: DC | PRN

## 2017-06-11 MED ORDER — METHOCARBAMOL 500 MG PO TABS: 500.0000 mg | ORAL_TABLET | Freq: Four times a day (QID) | ORAL | Status: DC | PRN

## 2017-06-11 MED ORDER — PANTOPRAZOLE SODIUM 40 MG PO TBEC: 40.0000 mg | DELAYED_RELEASE_TABLET | Freq: Every day | ORAL | Status: DC

## 2017-06-11 MED ORDER — DOXYCYCLINE HYCLATE 100 MG PO CAPS: 100.0000 mg | ORAL_CAPSULE | Freq: Two times a day (BID) | ORAL | 0 refills | Status: AC

## 2017-06-11 MED ORDER — PROPOFOL 10 MG/ML IV BOLUS
INTRAVENOUS | Status: AC
  Filled 2017-06-11: qty 40

## 2017-06-11 MED ORDER — ACETAMINOPHEN 500 MG PO TABS
1000.0000 mg | ORAL_TABLET | Freq: Four times a day (QID) | ORAL | Status: DC
  Administered 2017-06-12 (×2): 1000 mg via ORAL
  Filled 2017-06-11 (×2): qty 2

## 2017-06-11 MED ORDER — LIDOCAINE 2% (20 MG/ML) 5 ML SYRINGE
INTRAMUSCULAR | Status: DC | PRN
  Administered 2017-06-11: 100 mg via INTRAVENOUS

## 2017-06-11 MED ORDER — HYDROMORPHONE HCL 1 MG/ML IJ SOLN: 0.5000 mg | INTRAMUSCULAR | Status: DC | PRN

## 2017-06-11 MED ORDER — ACETAMINOPHEN 500 MG PO TABS
1000.0000 mg | ORAL_TABLET | Freq: Once | ORAL | Status: AC
  Administered 2017-06-11: 1000 mg via ORAL

## 2017-06-11 MED ORDER — METOCLOPRAMIDE HCL 5 MG/ML IJ SOLN: 5.0000 mg | Freq: Three times a day (TID) | INTRAMUSCULAR | Status: DC | PRN

## 2017-06-11 MED ORDER — ONDANSETRON HCL 4 MG/2ML IJ SOLN
INTRAMUSCULAR | Status: AC
  Filled 2017-06-11: qty 2

## 2017-06-11 MED ORDER — DEXTROSE 5 % IV SOLN: 500.0000 mg | Freq: Four times a day (QID) | INTRAVENOUS | Status: DC | PRN

## 2017-06-11 MED ORDER — MIDAZOLAM HCL 2 MG/2ML IJ SOLN
1.0000 mg | INTRAMUSCULAR | Status: DC | PRN
  Administered 2017-06-11: 2 mg via INTRAVENOUS

## 2017-06-11 MED ORDER — KETOROLAC TROMETHAMINE 30 MG/ML IJ SOLN
INTRAMUSCULAR | Status: AC
  Filled 2017-06-11: qty 1

## 2017-06-11 MED ORDER — HYDROMORPHONE HCL 1 MG/ML IJ SOLN
INTRAMUSCULAR | Status: AC
  Filled 2017-06-11: qty 0.5

## 2017-06-11 MED ORDER — ONDANSETRON HCL 4 MG PO TABS: 4.0000 mg | ORAL_TABLET | Freq: Three times a day (TID) | ORAL | 0 refills | Status: DC | PRN

## 2017-06-11 MED ORDER — CEFAZOLIN SODIUM 10 G IJ SOLR
3.0000 g | INTRAMUSCULAR | Status: AC
  Administered 2017-06-11: 3 g via INTRAVENOUS

## 2017-06-11 MED ORDER — BUPIVACAINE HCL (PF) 0.25 % IJ SOLN
INTRAMUSCULAR | Status: AC
  Filled 2017-06-11: qty 30

## 2017-06-11 MED ORDER — METOCLOPRAMIDE HCL 5 MG PO TABS: 5.0000 mg | ORAL_TABLET | Freq: Three times a day (TID) | ORAL | Status: DC | PRN

## 2017-06-11 MED ORDER — OXYCODONE HCL 5 MG PO TABS: 10.0000 mg | ORAL_TABLET | ORAL | Status: DC | PRN

## 2017-06-11 MED ORDER — SODIUM CHLORIDE 0.9 % IR SOLN
Status: DC | PRN
  Administered 2017-06-11: 3000 mL

## 2017-06-11 MED ORDER — SUGAMMADEX SODIUM 200 MG/2ML IV SOLN
INTRAVENOUS | Status: DC | PRN
  Administered 2017-06-11: 200 mg via INTRAVENOUS

## 2017-06-11 MED ORDER — ACETAMINOPHEN 500 MG PO TABS
ORAL_TABLET | ORAL | Status: AC
  Filled 2017-06-11: qty 2

## 2017-06-11 MED ORDER — SCOPOLAMINE 1 MG/3DAYS TD PT72: 1.0000 | MEDICATED_PATCH | Freq: Once | TRANSDERMAL | Status: DC | PRN

## 2017-06-11 MED ORDER — BUPIVACAINE HCL (PF) 0.25 % IJ SOLN
INTRAMUSCULAR | Status: DC | PRN
  Administered 2017-06-11: 6 mL

## 2017-06-11 MED ORDER — ACETAMINOPHEN 325 MG PO TABS: 650.0000 mg | ORAL_TABLET | ORAL | Status: DC | PRN

## 2017-06-11 MED ORDER — PROPOFOL 10 MG/ML IV BOLUS
INTRAVENOUS | Status: DC | PRN
  Administered 2017-06-11: 200 mg via INTRAVENOUS

## 2017-06-11 MED ORDER — SUGAMMADEX SODIUM 200 MG/2ML IV SOLN
INTRAVENOUS | Status: AC
  Filled 2017-06-11: qty 2

## 2017-06-11 MED ORDER — ROPIVACAINE HCL 5 MG/ML IJ SOLN
INTRAMUSCULAR | Status: DC | PRN
  Administered 2017-06-11: 30 mL via PERINEURAL

## 2017-06-11 MED ORDER — FENTANYL CITRATE (PF) 100 MCG/2ML IJ SOLN
INTRAMUSCULAR | Status: AC
  Filled 2017-06-11: qty 2

## 2017-06-11 MED ORDER — LACTATED RINGERS IV SOLN
INTRAVENOUS | Status: DC
  Administered 2017-06-11: 13:00:00 via INTRAVENOUS

## 2017-06-11 MED ORDER — CEFAZOLIN SODIUM-DEXTROSE 2-4 GM/100ML-% IV SOLN
2.0000 g | Freq: Four times a day (QID) | INTRAVENOUS | Status: AC
  Administered 2017-06-11 – 2017-06-12 (×3): 2 g via INTRAVENOUS
  Filled 2017-06-11 (×3): qty 100

## 2017-06-11 MED ORDER — KETOROLAC TROMETHAMINE 15 MG/ML IJ SOLN
15.0000 mg | Freq: Four times a day (QID) | INTRAMUSCULAR | Status: DC | PRN
  Administered 2017-06-11 – 2017-06-12 (×2): 15 mg via INTRAVENOUS
  Filled 2017-06-11: qty 1

## 2017-06-11 MED ORDER — HYDROMORPHONE HCL 1 MG/ML IJ SOLN
0.2500 mg | INTRAMUSCULAR | Status: DC | PRN
  Administered 2017-06-11 (×3): 0.5 mg via INTRAVENOUS

## 2017-06-11 MED ORDER — PROMETHAZINE HCL 25 MG/ML IJ SOLN: 6.2500 mg | INTRAMUSCULAR | Status: DC | PRN

## 2017-06-11 MED ORDER — CEFAZOLIN SODIUM-DEXTROSE 2-4 GM/100ML-% IV SOLN
INTRAVENOUS | Status: AC
  Filled 2017-06-11: qty 100

## 2017-06-11 MED ORDER — ASPIRIN EC 81 MG PO TBEC: 81.0000 mg | DELAYED_RELEASE_TABLET | Freq: Every day | ORAL | 0 refills | Status: DC

## 2017-06-11 MED ORDER — ONDANSETRON HCL 4 MG/2ML IJ SOLN
INTRAMUSCULAR | Status: DC | PRN
  Administered 2017-06-11: 4 mg via INTRAVENOUS

## 2017-06-11 MED ORDER — LACTATED RINGERS IV SOLN
INTRAVENOUS | Status: DC
  Administered 2017-06-11: 17:00:00 via INTRAVENOUS

## 2017-06-11 MED ORDER — DEXAMETHASONE SODIUM PHOSPHATE 10 MG/ML IJ SOLN
INTRAMUSCULAR | Status: AC
  Filled 2017-06-11: qty 1

## 2017-06-11 MED ORDER — MIDAZOLAM HCL 2 MG/2ML IJ SOLN
INTRAMUSCULAR | Status: AC
  Filled 2017-06-11: qty 2

## 2017-06-11 MED ORDER — OXYCODONE HCL 5 MG PO TABS
5.0000 mg | ORAL_TABLET | ORAL | Status: DC | PRN
  Administered 2017-06-12: 5 mg via ORAL
  Filled 2017-06-11: qty 1

## 2017-06-11 MED ORDER — POVIDONE-IODINE 7.5 % EX SOLN: Freq: Once | CUTANEOUS | Status: DC

## 2017-06-11 MED ORDER — ROCURONIUM BROMIDE 100 MG/10ML IV SOLN
INTRAVENOUS | Status: DC | PRN
  Administered 2017-06-11: 30 mg via INTRAVENOUS

## 2017-06-11 MED ORDER — DIPHENHYDRAMINE HCL 12.5 MG/5ML PO ELIX: 12.5000 mg | ORAL_SOLUTION | ORAL | Status: DC | PRN

## 2017-06-11 MED ORDER — OXYCODONE HCL 5 MG PO TABS: 5.0000 mg | ORAL_TABLET | Freq: Once | ORAL | Status: DC | PRN

## 2017-06-11 MED ORDER — ACETAMINOPHEN 650 MG RE SUPP: 650.0000 mg | RECTAL | Status: DC | PRN

## 2017-06-11 MED ORDER — DEXAMETHASONE SODIUM PHOSPHATE 4 MG/ML IJ SOLN
INTRAMUSCULAR | Status: DC | PRN
  Administered 2017-06-11: 10 mg via INTRAVENOUS

## 2017-06-11 MED ORDER — CEFAZOLIN SODIUM-DEXTROSE 1-4 GM/50ML-% IV SOLN
INTRAVENOUS | Status: AC
  Filled 2017-06-11: qty 50

## 2017-06-11 MED ORDER — ONDANSETRON HCL 4 MG/2ML IJ SOLN: 4.0000 mg | Freq: Four times a day (QID) | INTRAMUSCULAR | Status: DC | PRN

## 2017-06-11 MED ORDER — FENTANYL CITRATE (PF) 100 MCG/2ML IJ SOLN
50.0000 ug | INTRAMUSCULAR | Status: DC | PRN
  Administered 2017-06-11 (×2): 100 ug via INTRAVENOUS

## 2017-06-11 MED ORDER — SUCCINYLCHOLINE CHLORIDE 20 MG/ML IJ SOLN
INTRAMUSCULAR | Status: DC | PRN
  Administered 2017-06-11: 120 mg via INTRAVENOUS

## 2017-06-11 SURGICAL SUPPLY — 55 items
BANDAGE ACE 4X5 VEL STRL LF (GAUZE/BANDAGES/DRESSINGS) ×4 IMPLANT
BANDAGE ACE 6X5 VEL STRL LF (GAUZE/BANDAGES/DRESSINGS) IMPLANT
BLADE SURG 10 STRL SS (BLADE) IMPLANT
BLADE SURG 15 STRL LF DISP TIS (BLADE) ×2 IMPLANT
BLADE SURG 15 STRL SS (BLADE) ×2
BNDG COHESIVE 6X5 TAN STRL LF (GAUZE/BANDAGES/DRESSINGS) IMPLANT
BNDG GAUZE ELAST 4 BULKY (GAUZE/BANDAGES/DRESSINGS) IMPLANT
CHLORAPREP W/TINT 26ML (MISCELLANEOUS) ×2 IMPLANT
COVER BACK TABLE 80X110 HD (DRAPES) ×2 IMPLANT
COVER MAYO STAND STRL (DRAPES) IMPLANT
CUFF TOURNIQUET SINGLE 34IN LL (TOURNIQUET CUFF) IMPLANT
DRAPE EXTREMITY T 121X128X90 (DRAPE) ×2 IMPLANT
DRAPE IMP U-DRAPE 54X76 (DRAPES) ×2 IMPLANT
DRAPE OEC MINIVIEW 54X84 (DRAPES) IMPLANT
DRAPE SURG 17X23 STRL (DRAPES) IMPLANT
DRAPE U-SHAPE 47X51 STRL (DRAPES) ×2 IMPLANT
DRSG EMULSION OIL 3X3 NADH (GAUZE/BANDAGES/DRESSINGS) ×2 IMPLANT
DRSG PAD ABDOMINAL 8X10 ST (GAUZE/BANDAGES/DRESSINGS) ×2 IMPLANT
ELECT REM PT RETURN 9FT ADLT (ELECTROSURGICAL) ×2
ELECTRODE REM PT RTRN 9FT ADLT (ELECTROSURGICAL) ×1 IMPLANT
GAUZE SPONGE 4X4 12PLY STRL (GAUZE/BANDAGES/DRESSINGS) ×2 IMPLANT
GAUZE SPONGE 4X4 12PLY STRL LF (GAUZE/BANDAGES/DRESSINGS) ×2 IMPLANT
GLOVE BIO SURGEON STRL SZ7.5 (GLOVE) ×4 IMPLANT
GLOVE BIOGEL PI IND STRL 7.0 (GLOVE) ×1 IMPLANT
GLOVE BIOGEL PI IND STRL 8 (GLOVE) ×2 IMPLANT
GLOVE BIOGEL PI INDICATOR 7.0 (GLOVE) ×1
GLOVE BIOGEL PI INDICATOR 8 (GLOVE) ×2
GLOVE ECLIPSE 6.5 STRL STRAW (GLOVE) ×2 IMPLANT
GOWN STRL REUS W/ TWL LRG LVL3 (GOWN DISPOSABLE) ×3 IMPLANT
GOWN STRL REUS W/TWL LRG LVL3 (GOWN DISPOSABLE) ×3
MANIFOLD NEPTUNE II (INSTRUMENTS) ×2 IMPLANT
NEEDLE HYPO 25X1 1.5 SAFETY (NEEDLE) ×2 IMPLANT
NS IRRIG 1000ML POUR BTL (IV SOLUTION) IMPLANT
PACK BASIN DAY SURGERY FS (CUSTOM PROCEDURE TRAY) ×2 IMPLANT
PAD ARMBOARD 7.5X6 YLW CONV (MISCELLANEOUS) ×10 IMPLANT
PAD CAST 3X4 CTTN HI CHSV (CAST SUPPLIES) IMPLANT
PAD CAST 4YDX4 CTTN HI CHSV (CAST SUPPLIES) ×2 IMPLANT
PADDING CAST COTTON 3X4 STRL (CAST SUPPLIES)
PADDING CAST COTTON 4X4 STRL (CAST SUPPLIES) ×2
PENCIL BUTTON HOLSTER BLD 10FT (ELECTRODE) ×2 IMPLANT
SET IRRIG Y TYPE TUR BLADDER L (SET/KITS/TRAYS/PACK) ×2 IMPLANT
SPONGE LAP 18X18 X RAY DECT (DISPOSABLE) ×2 IMPLANT
SUCTION FRAZIER HANDLE 10FR (MISCELLANEOUS)
SUCTION TUBE FRAZIER 10FR DISP (MISCELLANEOUS) IMPLANT
SUT ETHILON 3 0 PS 1 (SUTURE) ×4 IMPLANT
SWAB COLLECTION DEVICE MRSA (MISCELLANEOUS) IMPLANT
SWAB CULTURE ESWAB REG 1ML (MISCELLANEOUS) IMPLANT
SYR BULB 3OZ (MISCELLANEOUS) IMPLANT
SYR CONTROL 10ML LL (SYRINGE) ×2 IMPLANT
TOWEL OR 17X24 6PK STRL BLUE (TOWEL DISPOSABLE) ×2 IMPLANT
TOWEL OR NON WOVEN STRL DISP B (DISPOSABLE) ×2 IMPLANT
TRAY DSU PREP LF (CUSTOM PROCEDURE TRAY) IMPLANT
TUBE CONNECTING 20X1/4 (TUBING) ×2 IMPLANT
UNDERPAD 30X30 (UNDERPADS AND DIAPERS) ×2 IMPLANT
YANKAUER SUCT BULB TIP NO VENT (SUCTIONS) ×2 IMPLANT

## 2017-06-11 NOTE — Anesthesia Procedure Notes (Signed)
Procedure Name: Intubation Date/Time: 06/11/2017 2:22 PM Performed by: Maryella Shivers, CRNA Pre-anesthesia Checklist: Patient identified, Emergency Drugs available, Suction available and Patient being monitored Patient Re-evaluated:Patient Re-evaluated prior to induction Oxygen Delivery Method: Circle system utilized Preoxygenation: Pre-oxygenation with 100% oxygen Induction Type: IV induction Ventilation: Mask ventilation without difficulty Laryngoscope Size: Mac and 4 Grade View: Grade II Tube type: Oral Tube size: 8.0 mm Number of attempts: 1 Airway Equipment and Method: Stylet and Oral airway Placement Confirmation: ETT inserted through vocal cords under direct vision,  positive ETCO2 and breath sounds checked- equal and bilateral Secured at: 22 cm Tube secured with: Tape Dental Injury: Teeth and Oropharynx as per pre-operative assessment

## 2017-06-11 NOTE — Transfer of Care (Signed)
Immediate Anesthesia Transfer of Care Note  Patient: Jeffrey Padilla  Procedure(s) Performed: INCISION AND DRAINAGE POSTOPERATIVE WOUND LEFT HEEL (Left Foot)  Patient Location: PACU  Anesthesia Type:General  Level of Consciousness: awake, alert  and oriented  Airway & Oxygen Therapy: Patient Spontanous Breathing and Patient connected to face mask oxygen  Post-op Assessment: Report given to RN and Post -op Vital signs reviewed and stable  Post vital signs: Reviewed and stable  Last Vitals:  Vitals:   06/11/17 1154  BP: 140/75  Pulse: 67  Resp: 18  Temp: 36.6 C  SpO2: 100%    Last Pain:  Vitals:   06/11/17 1154  TempSrc: Oral  PainSc: 6       Patients Stated Pain Goal: 4 (06/11/17 1154)  Complications: No apparent anesthesia complications

## 2017-06-11 NOTE — Anesthesia Preprocedure Evaluation (Addendum)
Anesthesia Evaluation  Patient identified by MRN, date of birth, ID band Patient awake    Reviewed: Allergy & Precautions, NPO status , Patient's Chart, lab work & pertinent test results  Airway Mallampati: II  TM Distance: >3 FB Neck ROM: Full    Dental  (+) Teeth Intact, Dental Advisory Given   Pulmonary neg pulmonary ROS,    Pulmonary exam normal breath sounds clear to auscultation       Cardiovascular negative cardio ROS Normal cardiovascular exam Rhythm:Regular Rate:Normal     Neuro/Psych negative neurological ROS  negative psych ROS   GI/Hepatic Neg liver ROS, GERD  Medicated and Controlled,  Endo/Other  Morbid obesity  Renal/GU negative Renal ROS     Musculoskeletal negative musculoskeletal ROS (+)   Abdominal (+) + obese,   Peds  Hematology negative hematology ROS (+)   Anesthesia Other Findings   Reproductive/Obstetrics                            Anesthesia Physical  Anesthesia Plan  ASA: III  Anesthesia Plan: General   Post-op Pain Management:    Induction: Intravenous  PONV Risk Score and Plan: 2 and Ondansetron and Dexamethasone  Airway Management Planned: Oral ETT  Additional Equipment:   Intra-op Plan:   Post-operative Plan: Extubation in OR  Informed Consent: I have reviewed the patients History and Physical, chart, labs and discussed the procedure including the risks, benefits and alternatives for the proposed anesthesia with the patient or authorized representative who has indicated his/her understanding and acceptance.   Dental advisory given  Plan Discussed with: CRNA  Anesthesia Plan Comments:        Anesthesia Quick Evaluation

## 2017-06-11 NOTE — Progress Notes (Signed)
Assisted Dr. Ellender with left, ultrasound guided, popliteal block. Side rails up, monitors on throughout procedure. See vital signs in flow sheet. Tolerated Procedure well. 

## 2017-06-11 NOTE — Discharge Instructions (Signed)
You must keep foot elevated above heart at all times.  Do not put direct pressure on the back of your heel.  When at home, remove boot, elevate foot and place a pillow behind leg to let heel float in the air without any direct pressure on the heel.  Diet: As you were doing prior to hospitalization   Dressing:  Keep dressings on and dry until follow up.  Activity:  Increase activity slowly as tolerated, but follow the weight bearing instructions below.  The rules on driving is that you can not be taking narcotics while you drive, and you must feel in control of the vehicle.    Weight Bearing:   Minimal weight bearing only when wearing boot.   To prevent constipation: you may use a stool softener such as -  Colace (over the counter) 100 mg by mouth twice a day  Drink plenty of fluids (prune juice may be helpful) and high fiber foods Miralax (over the counter) for constipation as needed.    Itching:  If you experience itching with your medications, try taking only a single pain pill, or even half a pain pill at a time.  You can also use benadryl over the counter for itching or also to help with sleep.   Precautions:  If you experience chest pain or shortness of breath - call 911 immediately for transfer to the hospital emergency department!!  If you develop a fever greater that 101 F, purulent drainage from wound, increased redness or drainage from wound, or calf pain -- Call the office at [phone]                                                 Follow- Up Appointment:  [facility] [phone]  Regional Anesthesia Blocks  1. Numbness or the inability to move the "blocked" extremity may last from 3-48 hours after placement. The length of time depends on the medication injected and your individual response to the medication. If the numbness is not going away after 48 hours, call your surgeon.  2. The extremity that is blocked will need to  be protected until the numbness is gone and the  Strength has returned. Because you cannot feel it, you will need to take extra care to avoid injury. Because it may be weak, you may have difficulty moving it or using it. You may not know what position it is in without looking at it while the block is in effect.  3. For blocks in the legs and feet, returning to weight bearing and walking needs to be done carefully. You will need to wait until the numbness is entirely gone and the strength has returned. You should be able to move your leg and foot normally before you try and bear weight or walk. You will need someone to be with you when you first try to ensure you do not fall and possibly risk injury.  4. Bruising and tenderness at the needle site are common side effects and will resolve in a few days.  5. Persistent numbness or new problems with movement should be communicated to the surgeon or the [facility] [phone])/ [facility] [phone]).

## 2017-06-11 NOTE — Interval H&P Note (Signed)
History and Physical Interval Note:  06/11/2017 12:29 PM  Jeffrey FieldJeffery F Uecker  has presented today for surgery, with the diagnosis of LEFT HEEL POSTOPERATIVE WOUND INFECTION  The various methods of treatment have been discussed with the patient and family. After consideration of risks, benefits and other options for treatment, the patient has consented to  Procedure(s): INCISION AND DRAINAGE POSTOPERATIVE WOUND LEFT HEEL (Left) as a surgical intervention .  The patient's history has been reviewed, patient examined, no change in status, stable for surgery.  I have reviewed the patient's chart and labs.  Questions were answered to the patient's satisfaction.     Marquavion Venhuizen D

## 2017-06-11 NOTE — Anesthesia Postprocedure Evaluation (Signed)
Anesthesia Post Note  Patient: Jeffrey Padilla  Procedure(s) Performed: INCISION AND DRAINAGE POSTOPERATIVE WOUND LEFT HEEL (Left Foot)     Patient location during evaluation: PACU Anesthesia Type: General and Regional Level of consciousness: awake and alert Pain management: pain level controlled Vital Signs Assessment: post-procedure vital signs reviewed and stable Respiratory status: spontaneous breathing, nonlabored ventilation, respiratory function stable and patient connected to nasal cannula oxygen Cardiovascular status: blood pressure returned to baseline and stable Postop Assessment: no apparent nausea or vomiting Anesthetic complications: no    Last Vitals:  Vitals:   06/11/17 1630 06/11/17 1700  BP: (!) 143/88 (!) 150/82  Pulse: 67 79  Resp: 16 18  Temp:  (!) 36.4 C  SpO2: 98% 98%    Last Pain:  Vitals:   06/11/17 1700  TempSrc:   PainSc: 2     LLE Motor Response: Purposeful movement (06/11/17 1700) LLE Sensation: Decreased("pressure only") (06/11/17 1700)          Bryn Saline P Gracelynn Bircher

## 2017-06-11 NOTE — Anesthesia Procedure Notes (Signed)
Anesthesia Regional Block: Popliteal block   Pre-Anesthetic Checklist: ,, timeout performed, Correct Patient, Correct Site, Correct Laterality, Correct Procedure,, site marked, risks and benefits discussed, Surgical consent,  Pre-op evaluation,  At surgeon's request and post-op pain management  Laterality: Left  Prep: chloraprep       Needles:  Injection technique: Single-shot  Needle Type: Echogenic Stimulator Needle     Needle Length: 10cm  Needle Gauge: 21     Additional Needles:   Procedures:,,,, ultrasound used (permanent image in chart),,,,  Narrative:  Start time: 06/11/2017 4:10 PM End time: 06/11/2017 4:20 PM Injection made incrementally with aspirations every 5 mL.  Performed by: Personally  Anesthesiologist: Leonides GrillsEllender, Yulanda Diggs P, MD  Additional Notes: Functioning IV was confirmed and monitors were applied.  A 100mm 21ga Pajunkechogenic stimulator needle was used. Sterile prep,hand hygiene and sterile gloves were used.  Negative aspiration and negative test dose prior to incremental administration of local anesthetic. The patient tolerated the procedure well.

## 2017-06-11 NOTE — Op Note (Signed)
06/11/2017  2:51 PM  PATIENT:  Jeffrey Padilla    PRE-OPERATIVE DIAGNOSIS:  LEFT HEEL POSTOPERATIVE WOUND INFECTION  POST-OPERATIVE DIAGNOSIS:  Same  PROCEDURE:  INCISION AND DRAINAGE POSTOPERATIVE WOUND LEFT HEEL  SURGEON:  Yoshino Broccoli, Jewel BaizeIMOTHY D, MD  ASSISTANT: Aquilla HackerHenry Martensen, PA-C, he was present and scrubbed throughout the case, critical for completion in a timely fashion, and for retraction, instrumentation, and closure.   ANESTHESIA:   gen  PREOPERATIVE INDICATIONS:  Jeffrey Padilla is a  46 y.o. male with a diagnosis of LEFT HEEL POSTOPERATIVE WOUND INFECTION who failed conservative measures and elected for surgical management.    The risks benefits and alternatives were discussed with the patient preoperatively including but not limited to the risks of infection, bleeding, nerve injury, cardiopulmonary complications, the need for revision surgery, among others, and the patient was willing to proceed.  OPERATIVE IMPLANTS: none  OPERATIVE FINDINGS: purulent fluid  BLOOD LOSS: min  COMPLICATIONS: none  TOURNIQUET TIME: none  OPERATIVE PROCEDURE:  Patient was identified in the preoperative holding area and site was marked by me He was transported to the operating theater and placed on the table in supine position taking care to pad all bony prominences. After a preincinduction time out anesthesia was induced. The left lower extremity was prepped and draped in normal sterile fashion and a pre-incision timeout was performed. He received held ancef until culture for preoperative antibiotics.   He was placed in the prone position prior to prepping and draping.  I made an incision through his previous Haglund excision incision.  Immediately discovered a pocket of purulent fluid with some necrotic tissue in it.  They did not appear to communicate with his speed bridge repair there was no exposed stitches or anchors.  I debrided all necrotic tissue using scissors this was an  excisional debridement.  I again explored the wound again I did not encounter stitches or anchors.  Given that his surgery to reattach his Achilles was recent I elected not to get these out.  After thorough irrigation I closed his incision with a nylon stitch placing a Penrose drain to maintain some drainage.  A sterile dressing was applied he was awoken placed in a boot taken the PACU in stable condition.  POST OPERATIVE PLAN: mobilize for dvt px

## 2017-06-11 NOTE — H&P (Signed)
     ORTHOPAEDIC CONSULTATION  REQUESTING PHYSICIAN: Renette Butters, MD  Chief Complaint: left heel pain  HPI: Jeffrey Padilla is a 46 y.o. male who complains of pain and drainage from a surgical incision on left heel  Past Medical History:  Diagnosis Date  . Abdominal pain   . Acid reflux    Past Surgical History:  Procedure Laterality Date  . PLANTAR FASCIECTOMY  2012  . 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 Marital status: Married    Spouse name: None  . Number of children: None  . Years of education: None  . Highest education level: None  Social Needs  . Financial resource strain: None  . Food insecurity - worry: None  . Food insecurity - inability: None  . Transportation needs - medical: None  . Transportation needs - non-medical: None  Occupational History  . None  Tobacco Use  . Smoking status: Never Smoker  . Smokeless tobacco: Never Used  Substance and Sexual Activity  . Alcohol use: No  . Drug use: No  . Sexual activity: None  Other Topics Concern  . None  Social History Narrative  . None   History reviewed. No pertinent family history. Allergies  Allergen Reactions  . Morphine And Related Anaphylaxis    "stops my heart"   . Penicillins Rash    As a child    Prior to Admission medications   Medication Sig Start Date End Date Taking? Authorizing Provider  Esomeprazole Magnesium (NEXIUM PO) Take by mouth daily.     Yes [provider]  oxyCODONE-acetaminophen (ROXICET) 5-325 MG tablet Take 1-2 tablets by mouth every 4 (four) hours as needed. 03/21/17  Yes Dwana Melena L, PA-C   No results found.  Positive ROS: All other systems have been reviewed and were otherwise negative with the exception of those mentioned in the HPI and as above.  Labs cbc No results for input(s): WBC, HGB, HCT, PLT in the last 72 hours.  Labs inflam No results for input(s): CRP in the last  72 hours.  Invalid input(s): ESR  Labs coag No results for input(s): INR, PTT in the last 72 hours.  Invalid input(s): PT  No results for input(s): NA, K, CL, CO2, GLUCOSE, BUN, CREATININE, CALCIUM in the last 72 hours.  Physical Exam: Vitals:   06/11/17 1154  BP: 140/75  Pulse: 67  Resp: 18  Temp: 97.8 F (36.6 C)  SpO2: 100%   General: Alert, no acute distress Cardiovascular: No pedal edema Respiratory: No cyanosis, no use of accessory musculature GI: No organomegaly, abdomen is soft and non-tender Skin: No lesions in the area of chief complaint other than those listed below in MSK exam.  Neurologic: Sensation intact distally save for the below mentioned MSK exam Psychiatric: Patient is competent for consent with normal mood and affect Lymphatic: No axillary or cervical lymphadenopathy  MUSCULOSKELETAL:  LLE: wound drainage, Erythema and fluctuance, NVI Other extremities are atraumatic with painless ROM and NVI.  Assessment: Infected heel wound  Plan: I&D today   Renette Butters, MD Cell [phone]   06/11/2017 12:29 PM

## 2017-06-12 ENCOUNTER — Encounter (HOSPITAL_BASED_OUTPATIENT_CLINIC_OR_DEPARTMENT_OTHER): Payer: Self-pay | Admitting: Orthopedic Surgery

## 2017-06-12 DIAGNOSIS — T8141XA Infection following a procedure, superficial incisional surgical site, initial encounter: Secondary | ICD-10-CM | POA: Diagnosis not present

## 2017-06-16 LAB — AEROBIC/ANAEROBIC CULTURE (SURGICAL/DEEP WOUND): Culture: NEGATIVE

## 2017-06-16 LAB — AEROBIC/ANAEROBIC CULTURE W GRAM STAIN (SURGICAL/DEEP WOUND)

## 2018-03-07 ENCOUNTER — Other Ambulatory Visit: Payer: Self-pay

## 2018-03-07 ENCOUNTER — Emergency Department (HOSPITAL_COMMUNITY)
Admission: EM | Admit: 2018-03-07 | Discharge: 2018-03-07 | Disposition: A | Discharge status: Home / Self Care | Payer: PRIVATE HEALTH INSURANCE | Attending: Emergency Medicine | Admitting: Emergency Medicine

## 2018-03-07 DIAGNOSIS — R1084 Generalized abdominal pain: Secondary | ICD-10-CM | POA: Diagnosis present

## 2018-03-07 DIAGNOSIS — N23 Unspecified renal colic: Secondary | ICD-10-CM | POA: Diagnosis not present

## 2018-03-07 DIAGNOSIS — Z7982 Long term (current) use of aspirin: Secondary | ICD-10-CM | POA: Insufficient documentation

## 2018-03-07 DIAGNOSIS — R109 Unspecified abdominal pain: Secondary | ICD-10-CM

## 2018-03-07 LAB — URINALYSIS, ROUTINE W REFLEX MICROSCOPIC
Bacteria, UA: NONE SEEN
Bilirubin Urine: NEGATIVE
Glucose, UA: NEGATIVE mg/dL
Ketones, ur: NEGATIVE mg/dL
Leukocytes, UA: NEGATIVE
Nitrite: NEGATIVE
Protein, ur: 100 mg/dL — AB
RBC / HPF: >50 RBC/hpf — ABNORMAL HIGH (ref 0–5)
Specific Gravity, Urine: 1.029 (ref 1.005–1.030)
pH: 6 (ref 5.0–8.0)

## 2018-03-07 MED ORDER — OXYCODONE-ACETAMINOPHEN 5-325 MG PO TABS
2.0000 | ORAL_TABLET | Freq: Once | ORAL | Status: AC
  Administered 2018-03-07: 2 via ORAL
  Filled 2018-03-07: qty 2

## 2018-03-07 MED ORDER — IBUPROFEN 600 MG PO TABS: 600.0000 mg | ORAL_TABLET | Freq: Four times a day (QID) | ORAL | 0 refills | Status: DC | PRN

## 2018-03-07 MED ORDER — KETOROLAC TROMETHAMINE 30 MG/ML IJ SOLN
30.0000 mg | Freq: Once | INTRAMUSCULAR | Status: AC
  Administered 2018-03-07: 30 mg via INTRAMUSCULAR
  Filled 2018-03-07: qty 1

## 2018-03-07 MED ORDER — OXYCODONE-ACETAMINOPHEN 5-325 MG PO TABS: 1.0000 | ORAL_TABLET | Freq: Four times a day (QID) | ORAL | 0 refills | Status: DC | PRN

## 2018-03-07 MED ORDER — ONDANSETRON HCL 4 MG PO TABS: 4.0000 mg | ORAL_TABLET | Freq: Four times a day (QID) | ORAL | 0 refills | Status: DC | PRN

## 2018-03-07 NOTE — ED Provider Notes (Signed)
[facility] EMERGENCY DEPARTMENT Provider Note   CSN: [id number] Arrival date & time: 03/07/18  2440     History   Chief Complaint Chief Complaint  Patient presents with  . Flank Pain    hx of kidney stones    HPI Jeffrey Padilla is a 47 y.o. male.  HPI   47 year old male with right flank pain.  Symptom onset while sleeping around 430 this morning.  He went to bed in his usual state of health.  Describes sharp pain in his right flank radiating to his right groin.  Associate with nausea.  He had some Zofran which she was previously prescribed and took this with some improvement of the nausea.  Pain has persisted.  No fevers or chills.  Feels like he is having some difficulty voiding but did provide a sample prior to my evaluation.  Denies any dysuria.  He has a history of kidney stones and states that current symptoms feel similar.  He has not required prior intervention.  He has a morphine allergy listed as anaphylaxis but states that he tolerated Percocet after previous left foot surgery.   Past Medical History:  Diagnosis Date  . Abdominal pain   . Acid reflux     Patient Active Problem List   Diagnosis Date Noted  . Left foot infection 06/11/2017    Past Surgical History:  Procedure Laterality Date  . EXCISION HAGLUND'S DEFORMITY WITH ACHILLES TENDON REPAIR Left 03/21/2017   Procedure: LEFT FOOT PARTIAL CALCANEUS EXCISION , LEFT ACHILLES TENDON REPAIR;  Surgeon: Loreta Ave, MD;  Location: [facility];  Service: Orthopedics;  Laterality: Left;  . INCISION AND DRAINAGE Left 06/11/2017   Procedure: INCISION AND DRAINAGE POSTOPERATIVE WOUND LEFT HEEL;  Surgeon: Sheral Apley, MD;  Location:  SURGERY CENTER;  Service: Orthopedics;  Laterality: Left;  . PLANTAR FASCIECTOMY  2012  . 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  Authorizing Provider  aspirin EC 81 MG tablet Take 1 tablet (81 mg total) daily by mouth. For 30 days postoperatively for DVT prophylaxis 06/11/17   Albina Billet III, PA-C  Esomeprazole Magnesium (NEXIUM PO) Take by mouth daily.      [provider]  ondansetron (ZOFRAN) 4 MG tablet Take 1 tablet (4 mg total) every 8 (eight) hours as needed by mouth for nausea or vomiting. 06/11/17   Albina Billet III, PA-C  oxyCODONE-acetaminophen (ROXICET) 5-325 MG tablet Take 1-2 tablets every 4 (four) hours as needed by mouth for severe pain. 06/11/17   Albina Billet III, PA-C    Family History No family history on file.  Social History Social History   Tobacco Use  . Smoking status: Never Smoker  . Smokeless tobacco: Never Used  Substance Use Topics  . Alcohol use: No  . Drug use: No     Allergies   Morphine and related and Penicillins   Review of Systems Review of Systems  All systems reviewed and negative, other than as noted in HPI.  Physical Exam Updated Vital Signs BP (!) 146/80 (BP Location: Left Arm)   Pulse 70   Temp 97.8 F (36.6 C) (Oral)   Resp 17   SpO2 100%   Physical Exam  Constitutional: He appears well-developed and well-nourished. No distress.  Ambulated from the bathroom back to  his room.  Does appear somewhat uncomfortable.  HENT:  Head: Normocephalic and atraumatic.  Eyes: Conjunctivae are normal. Right eye exhibits no discharge. Left eye exhibits no discharge.  Neck: Neck supple.  Cardiovascular: Normal rate, regular rhythm and normal heart sounds. Exam reveals no gallop and no friction rub.  No murmur heard. Pulmonary/Chest: Effort normal and breath sounds normal. No respiratory distress.  Abdominal: Soft. He exhibits no distension. There is no tenderness.  Abdomen is soft and nontender.  No CVA tenderness.  Musculoskeletal: He exhibits no edema or tenderness.  Neurological: He is alert.  Skin: Skin is warm and dry.    Psychiatric: He has a normal mood and affect. His behavior is normal. Thought content normal.  Nursing note and vitals reviewed.    ED Treatments / Results  Labs (all labs ordered are listed, but only abnormal results are displayed) Labs Reviewed  URINALYSIS, ROUTINE W REFLEX MICROSCOPIC - Abnormal; Notable for the following components:      Result Value   APPearance HAZY (*)    Hgb urine dipstick LARGE (*)    Protein, ur 100 (*)    RBC / HPF >50 (*)    All other components within normal limits    EKG None  Radiology No results found.  Procedures Procedures (including critical care time)  Medications Ordered in ED Medications  oxyCODONE-acetaminophen (PERCOCET/ROXICET) 5-325 MG per tablet 2 tablet (has no administration in time range)  ketorolac (TORADOL) 30 MG/ML injection 30 mg (has no administration in time range)     Initial Impression / Assessment and Plan / ED Course  I have reviewed the triage vital signs and the nursing notes.  Pertinent labs & imaging results that were available during my care of the patient were reviewed by me and considered in my medical decision making (see chart for details).     47 year old male with symptoms consistent with ureteral colic.  He has a history of the same.  He is afebrile.  He looks somewhat uncomfortable but not toxic.  A very low suspicion for alternative etiology such as abdominal aortic aneurysm, biliary colic, appendicitis etc.  We will treat symptomatically at this point and check urinalysis.  I do not feel strongly that he needs imaging at this point time but may consider if we cannot get his symptoms under adequate control.  We will check his UA.  Anticipate discharge with expectant management.  UA with blood as expected. Not consistent with UTI.   It has been determined that no acute conditions requiring further emergency intervention are present at this time. The patient has been advised of the diagnosis and plan.  I reviewed labs. We have discussed signs and symptoms that warrant return to the ED and they are listed in the discharge instructions.   Final Clinical Impressions(s) / ED Diagnoses   Final diagnoses:  Flank pain  Ureteral colic    ED Discharge Orders    None       Raeford RazorKohut, Jeyli Zwicker, MD 03/07/18 [phone]

## 2018-03-07 NOTE — ED Triage Notes (Signed)
Pt c/o sudden right flank pain this am with nausea. Denies vomiting. Making little urine. Restless. Pt states has hx of kidney stones and feels like another

## 2018-03-24 ENCOUNTER — Other Ambulatory Visit: Payer: Self-pay

## 2018-03-24 ENCOUNTER — Encounter (HOSPITAL_COMMUNITY): Payer: Self-pay | Admitting: Emergency Medicine

## 2018-03-24 ENCOUNTER — Emergency Department (HOSPITAL_COMMUNITY)
Admission: EM | Admit: 2018-03-24 | Discharge: 2018-03-24 | Disposition: A | Discharge status: Home / Self Care | Payer: PRIVATE HEALTH INSURANCE | Attending: Emergency Medicine | Admitting: Emergency Medicine

## 2018-03-24 ENCOUNTER — Emergency Department (HOSPITAL_COMMUNITY): Payer: PRIVATE HEALTH INSURANCE

## 2018-03-24 DIAGNOSIS — S0990XA Unspecified injury of head, initial encounter: Secondary | ICD-10-CM | POA: Diagnosis not present

## 2018-03-24 DIAGNOSIS — Z79899 Other long term (current) drug therapy: Secondary | ICD-10-CM | POA: Insufficient documentation

## 2018-03-24 DIAGNOSIS — Y9311 Activity, swimming: Secondary | ICD-10-CM | POA: Insufficient documentation

## 2018-03-24 DIAGNOSIS — Z7982 Long term (current) use of aspirin: Secondary | ICD-10-CM | POA: Diagnosis not present

## 2018-03-24 DIAGNOSIS — Y92146 Swimming-pool of prison as the place of occurrence of the external cause: Secondary | ICD-10-CM | POA: Insufficient documentation

## 2018-03-24 DIAGNOSIS — H1031 Unspecified acute conjunctivitis, right eye: Secondary | ICD-10-CM | POA: Diagnosis not present

## 2018-03-24 DIAGNOSIS — Y999 Unspecified external cause status: Secondary | ICD-10-CM | POA: Diagnosis not present

## 2018-03-24 DIAGNOSIS — S060X0A Concussion without loss of consciousness, initial encounter: Secondary | ICD-10-CM | POA: Insufficient documentation

## 2018-03-24 DIAGNOSIS — H10211 Acute toxic conjunctivitis, right eye: Secondary | ICD-10-CM

## 2018-03-24 DIAGNOSIS — W16022A Fall into swimming pool striking bottom causing other injury, initial encounter: Secondary | ICD-10-CM | POA: Diagnosis not present

## 2018-03-24 DIAGNOSIS — S134XXA Sprain of ligaments of cervical spine, initial encounter: Secondary | ICD-10-CM | POA: Diagnosis not present

## 2018-03-24 DIAGNOSIS — S139XXA Sprain of joints and ligaments of unspecified parts of neck, initial encounter: Secondary | ICD-10-CM

## 2018-03-24 MED ORDER — OXYCODONE-ACETAMINOPHEN 5-325 MG PO TABS
2.0000 | ORAL_TABLET | Freq: Once | ORAL | Status: AC
  Administered 2018-03-24: 2 via ORAL
  Filled 2018-03-24: qty 2

## 2018-03-24 MED ORDER — TETRACAINE HCL 0.5 % OP SOLN
2.0000 [drp] | Freq: Once | OPHTHALMIC | Status: AC
  Administered 2018-03-24: 2 [drp] via OPHTHALMIC
  Filled 2018-03-24: qty 4

## 2018-03-24 MED ORDER — ONDANSETRON HCL 4 MG PO TABS: 4.0000 mg | ORAL_TABLET | Freq: Three times a day (TID) | ORAL | 0 refills | Status: DC | PRN

## 2018-03-24 MED ORDER — IBUPROFEN 800 MG PO TABS: 800.0000 mg | ORAL_TABLET | Freq: Three times a day (TID) | ORAL | 0 refills | Status: DC | PRN

## 2018-03-24 MED ORDER — FLUORESCEIN SODIUM 1 MG OP STRP
1.0000 | ORAL_STRIP | Freq: Once | OPHTHALMIC | Status: AC
  Administered 2018-03-24: 1 via OPHTHALMIC
  Filled 2018-03-24: qty 1

## 2018-03-24 MED ORDER — CYCLOBENZAPRINE HCL 10 MG PO TABS: 10.0000 mg | ORAL_TABLET | Freq: Three times a day (TID) | ORAL | 0 refills | Status: DC | PRN

## 2018-03-24 NOTE — ED Notes (Signed)
Pt states he is having significant pain behind right eye, neck, and head. Does not remember any events occurring after fall  Yesterday.
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
## 2018-03-24 NOTE — ED Notes (Signed)
Per CT, pt is going over next.

## 2018-03-24 NOTE — ED Notes (Signed)
Pt to CT
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
## 2018-05-22 ENCOUNTER — Ambulatory Visit: Payer: PRIVATE HEALTH INSURANCE | Admitting: Neurology

## 2018-05-22 ENCOUNTER — Telehealth: Payer: Self-pay | Admitting: Neurology

## 2018-05-22 ENCOUNTER — Encounter: Payer: Self-pay | Admitting: Neurology

## 2018-05-22 VITALS — BP 142/82 | HR 60 | Ht 72.0 in | Wt 254.5 lb

## 2018-05-22 DIAGNOSIS — R51 Headache: Secondary | ICD-10-CM

## 2018-05-22 DIAGNOSIS — F0781 Postconcussional syndrome: Secondary | ICD-10-CM

## 2018-05-22 DIAGNOSIS — R4689 Other symptoms and signs involving appearance and behavior: Secondary | ICD-10-CM

## 2018-05-22 DIAGNOSIS — M542 Cervicalgia: Secondary | ICD-10-CM

## 2018-05-22 DIAGNOSIS — S0990XA Unspecified injury of head, initial encounter: Secondary | ICD-10-CM | POA: Diagnosis not present

## 2018-05-22 DIAGNOSIS — R519 Headache, unspecified: Secondary | ICD-10-CM
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
## 2018-05-22 NOTE — Telephone Encounter (Signed)
[employer] order sent to GI. They obtain the auth and will reach out to the pt to schedule.  °

## 2018-05-26 ENCOUNTER — Telehealth: Payer: Self-pay | Admitting: Neurology

## 2018-05-26 MED ORDER — NORTRIPTYLINE HCL 25 MG PO CAPS: 25.0000 mg | ORAL_CAPSULE | Freq: Every day | ORAL | 5 refills | Status: DC

## 2018-05-26 NOTE — Telephone Encounter (Signed)
Pt calling to inform that even with the injection he received last week  he is still having headaches.  Pt is asking for a call

## 2018-05-26 NOTE — Addendum Note (Signed)
Addended by: Hillis Range on: 05/26/2018 04:36 PM   Modules accepted: Orders

## 2018-05-26 NOTE — Telephone Encounter (Signed)
I called pt back. Advised I spoke with Dr. Epimenio Foot and he would like to start him on nortriptyline 25mg  qhs #30, 5 refills. Advised him to call if he has any SE or further questions/concerns. Advised him it can take a couple weeks to reach max benefit.

## 2018-06-04 ENCOUNTER — Ambulatory Visit: Payer: PRIVATE HEALTH INSURANCE | Admitting: Neurology

## 2018-06-04 DIAGNOSIS — R2990 Unspecified symptoms and signs involving the nervous system: Secondary | ICD-10-CM

## 2018-06-04 DIAGNOSIS — F0781 Postconcussional syndrome: Secondary | ICD-10-CM

## 2018-06-04 DIAGNOSIS — R4689 Other symptoms and signs involving appearance and behavior: Secondary | ICD-10-CM

## 2018-06-04 NOTE — Progress Notes (Signed)
   [facility]  EEG (ELECTROENCEPHALOGRAM) REPORT   STUDY DATE: 06/04/2018 PATIENT NAME: Jeffrey Padilla DOB: 04-01-1971 MRN: [id number]  ORDERING CLINICIAN: Shan Valdes A. Epimenio Foot, MD. PhD  TECHNOLOGIST: Charlesetta Ivory TECHNIQUE: Electroencephalogram was recorded utilizing standard 10-20 system of lead placement and reformatted into average and bipolar montages.  RECORDING TIME: 23 minutes 22 seconds  CLINICAL INFORMATION: 47 year old man with a postconcussive syndrome with behavioral changes  FINDINGS: A digital EEG was performed while the patient was awake and drowsy. While awake and most alert there was a 10.5 hz posterior dominant rhythm. Voltages and frequencies were symmetric.  There were no focal, lateralizing, epileptiform activity or seizures seen.  Photic stimulation had a normal driving response. Hyperventilation and recovery did not change the underlying rhythms.  Towards the end of the recording, stage N1 and stage N2 sleep was recorded.  EKG channel shows normal sinus rhythm.  IMPRESSION: This is a normal EEG while the patient was awake and sleep.   INTERPRETING PHYSICIAN:   Daelan Gatt A. Epimenio Foot, MD, PhD, [facility] Certified in Neurology, Clinical Neurophysiology, Sleep Medicine, Pain Medicine and Neuroimaging  [facility] [address], [address] [place], [place] [address] [phone]

## 2018-06-05 ENCOUNTER — Telehealth: Payer: Self-pay | Admitting: *Deleted

## 2018-06-05 NOTE — Telephone Encounter (Signed)
Called and spoke with patient about normal EEG. He verbalized understanding. He is scheduled next week for MRI.

## 2018-06-05 NOTE — Telephone Encounter (Signed)
-----   Message from Asa Lente, MD sent at 06/04/2018  6:28 PM EDT ----- Regarding: EEG report Please let him know that the EEG was normal.

## 2018-06-09 ENCOUNTER — Telehealth: Payer: Self-pay | Admitting: *Deleted

## 2018-06-09 ENCOUNTER — Ambulatory Visit
Admission: RE | Admit: 2018-06-09 | Discharge: 2018-06-09 | Disposition: A | Discharge status: Home / Self Care | Payer: PRIVATE HEALTH INSURANCE | Source: Ambulatory Visit | Attending: Neurology | Admitting: Neurology

## 2018-06-09 DIAGNOSIS — R519 Headache, unspecified: Secondary | ICD-10-CM

## 2018-06-09 DIAGNOSIS — R51 Headache: Secondary | ICD-10-CM

## 2018-06-09 NOTE — Telephone Encounter (Signed)
-----   Message from Asa Lente, MD sent at 06/09/2018  4:58 PM EST ----- Please let him know that the MRI of the brain was normal.  There is very mild chronic sinusitis that would be unlikely to cause any symptoms.

## 2018-06-09 NOTE — Telephone Encounter (Signed)
Called, LVM for pt about results per Dr. Sater note. Gave [facility] phone number if he has further questions/concerns.  

## 2018-07-22 ENCOUNTER — Ambulatory Visit: Payer: PRIVATE HEALTH INSURANCE | Admitting: Neurology

## 2018-07-23 ENCOUNTER — Encounter: Payer: Self-pay | Admitting: Neurology

## 2019-07-07 ENCOUNTER — Other Ambulatory Visit: Payer: Self-pay

## 2019-07-07 DIAGNOSIS — Z20822 Contact with and (suspected) exposure to covid-19: Secondary | ICD-10-CM

## 2019-07-09 LAB — NOVEL CORONAVIRUS, NAA: SARS-CoV-2, NAA: NOT DETECTED

## 2019-11-06 ENCOUNTER — Emergency Department (HOSPITAL_COMMUNITY)
Admission: EM | Admit: 2019-11-06 | Discharge: 2019-11-06 | Disposition: A | Discharge status: Home / Self Care | Payer: Worker's Compensation | Attending: Emergency Medicine | Admitting: Emergency Medicine

## 2019-11-06 ENCOUNTER — Other Ambulatory Visit: Payer: Self-pay

## 2019-11-06 ENCOUNTER — Encounter (HOSPITAL_COMMUNITY): Payer: Self-pay | Admitting: Emergency Medicine

## 2019-11-06 DIAGNOSIS — Y990 Civilian activity done for income or pay: Secondary | ICD-10-CM | POA: Diagnosis not present

## 2019-11-06 DIAGNOSIS — T300 Burn of unspecified body region, unspecified degree: Secondary | ICD-10-CM

## 2019-11-06 DIAGNOSIS — T22152A Burn of first degree of left shoulder, initial encounter: Secondary | ICD-10-CM | POA: Insufficient documentation

## 2019-11-06 DIAGNOSIS — Y92008 Other place in unspecified non-institutional (private) residence as the place of occurrence of the external cause: Secondary | ICD-10-CM | POA: Insufficient documentation

## 2019-11-06 DIAGNOSIS — X000XXA Exposure to flames in uncontrolled fire in building or structure, initial encounter: Secondary | ICD-10-CM | POA: Diagnosis not present

## 2019-11-06 DIAGNOSIS — Y9389 Activity, other specified: Secondary | ICD-10-CM | POA: Insufficient documentation

## 2019-11-06 DIAGNOSIS — T22052A Burn of unspecified degree of left shoulder, initial encounter: Secondary | ICD-10-CM | POA: Diagnosis present

## 2019-11-06 MED ORDER — HYDROCODONE-ACETAMINOPHEN 5-325 MG PO TABS
2.0000 | ORAL_TABLET | Freq: Once | ORAL | Status: AC
  Administered 2019-11-06: 2 via ORAL
  Filled 2019-11-06: qty 2

## 2019-11-06 MED ORDER — SILVER SULFADIAZINE 1 % EX CREA
TOPICAL_CREAM | Freq: Once | CUTANEOUS | Status: AC
  Filled 2019-11-06: qty 50
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
## 2019-11-06 NOTE — ED Notes (Signed)
Dressing applied. 

## 2020-04-04 ENCOUNTER — Encounter (HOSPITAL_BASED_OUTPATIENT_CLINIC_OR_DEPARTMENT_OTHER): Payer: Self-pay

## 2020-04-04 DIAGNOSIS — R5383 Other fatigue: Secondary | ICD-10-CM

## 2020-04-04 DIAGNOSIS — G4733 Obstructive sleep apnea (adult) (pediatric): Secondary | ICD-10-CM

## 2020-04-20 ENCOUNTER — Other Ambulatory Visit: Payer: Self-pay

## 2020-04-20 ENCOUNTER — Ambulatory Visit: Payer: PRIVATE HEALTH INSURANCE | Attending: Family Medicine | Admitting: Neurology

## 2020-04-20 DIAGNOSIS — G4730 Sleep apnea, unspecified: Secondary | ICD-10-CM | POA: Diagnosis not present

## 2020-04-20 DIAGNOSIS — R5383 Other fatigue: Secondary | ICD-10-CM

## 2020-04-20 DIAGNOSIS — Z79899 Other long term (current) drug therapy: Secondary | ICD-10-CM | POA: Diagnosis not present

## 2020-04-20 DIAGNOSIS — G4733 Obstructive sleep apnea (adult) (pediatric): Secondary | ICD-10-CM

## 2020-04-30 NOTE — Procedures (Signed)
Herrin A. Merlene Laughter, MD     [email or web address]             NOCTURNAL POLYSOMNOGRAPHY   LOCATION: [facility]  Patient Name: Jeffrey Padilla, Jeffrey Padilla Date: 04/20/2020 Gender: Male D.O.B: 1971-02-15 Age (years): 55 Referring Provider: Manon Hilding Height (inches): 72 Interpreting Physician: Phillips Odor MD, ABSM Weight (lbs): 295 RPSGT: Peak, Robert BMI: 40 MRN: [id number] Neck Size: 18.00 CLINICAL INFORMATION Sleep Study Type: Split Night CPAP     Indication for sleep study: N/A     Epworth Sleepiness Score: 10  SLEEP STUDY TECHNIQUE As per the AASM Manual for the Scoring of Sleep and Associated Events v2.3 (April 2016) with a hypopnea requiring 4% desaturations.  The channels recorded and monitored were frontal, central and occipital EEG, electrooculogram (EOG), submentalis EMG (chin), nasal and oral airflow, thoracic and abdominal wall motion, anterior tibialis EMG, snore microphone, electrocardiogram, and pulse oximetry. Continuous positive airway pressure (CPAP) was initiated when the patient met split night criteria and was titrated according to treat sleep-disordered breathing.  MEDICATIONS Medications self-administered by patient taken the night of the study : N/A  Current Outpatient Medications:  .  acetaminophen (TYLENOL) 325 MG tablet, Take 650 mg by mouth every 6 (six) hours as needed., Disp: , Rfl:  .  ibuprofen (ADVIL,MOTRIN) 200 MG tablet, Take 200 mg by mouth every 6 (six) hours as needed., Disp: , Rfl:  .  nortriptyline (PAMELOR) 25 MG capsule, Take 1 capsule (25 mg total) by mouth at bedtime., Disp: 30 capsule, Rfl: 5   RESPIRATORY PARAMETERS Diagnostic  Total AHI (/hr): 47.4 RDI (/hr): 47.4 OA Index (/hr): 40 CA Index (/hr): 0.4 REM AHI (/hr): 42.9 NREM AHI (/hr): 48.1 Supine AHI (/hr): 47.4 Non-supine AHI (/hr): 0 Min O2 Sat (%): 78.0 Mean O2 (%): 89.4 Time below 88% (min): 85.1   Titration  Optimal Pressure  (cm): 14 AHI at Optimal Pressure (/hr): 1.5 Min O2 at Optimal Pressure (%): 89.0 Supine % at Optimal (%): 100 Sleep % at Optimal (%): 92   SLEEP ARCHITECTURE The recording time for the entire night was 453.2 minutes.  During a baseline period of 174.7 minutes, the patient slept for 169.5 minutes in REM and nonREM, yielding a sleep efficiency of 97.0%%. Sleep onset after lights out was 3.9 minutes with a REM latency of 98.0 minutes. The patient spent 1.2%% of the night in stage N1 sleep, 86.4%% in stage N2 sleep, 0.0%% in stage N3 and 12.4% in REM.     During the titration period of 277.4 minutes, the patient slept for 267.0 minutes in REM and nonREM, yielding a sleep efficiency of 96.2%%. Sleep onset after CPAP initiation was 2.2 minutes with a REM latency of 55.0 minutes. The patient spent 1.5%% of the night in stage N1 sleep, 55.6%% in stage N2 sleep, 0.0%% in stage N3 and 42.9% in REM.  CARDIAC DATA The 2 lead EKG demonstrated sinus rhythm. The mean heart rate was 100.0 beats per minute. Other EKG findings include: None. LEG MOVEMENT DATA The total Periodic Limb Movements of Sleep (PLMS) were 0. The PLMS index was 0.0.  IMPRESSIONS Severe obstructive sleep apnea occurred during the diagnostic portion of the study (AHI = 47.4/hour). The optimal CPAP selected for this patient is ( 14 cm of water)   Delano Metz, MD Diplomate, American Board of Sleep Medicine.  ELECTRONICALLY SIGNED ON:  04/30/2020, 9:24 PM [place] PH: [phone]   FX: [phone] ACCREDITED BY  THE AMERICAN ACADEMY OF SLEEP MEDICINE

## 2020-09-05 ENCOUNTER — Observation Stay (HOSPITAL_COMMUNITY)
Admission: EM | Admit: 2020-09-05 | Discharge: 2020-09-06 | Disposition: A | Discharge status: Home / Self Care | Payer: PRIVATE HEALTH INSURANCE | Attending: Physician Assistant | Admitting: Physician Assistant

## 2020-09-05 ENCOUNTER — Other Ambulatory Visit: Payer: Self-pay

## 2020-09-05 ENCOUNTER — Emergency Department (HOSPITAL_COMMUNITY): Payer: PRIVATE HEALTH INSURANCE

## 2020-09-05 DIAGNOSIS — S060X1A Concussion with loss of consciousness of 30 minutes or less, initial encounter: Secondary | ICD-10-CM | POA: Insufficient documentation

## 2020-09-05 DIAGNOSIS — S2241XA Multiple fractures of ribs, right side, initial encounter for closed fracture: Principal | ICD-10-CM | POA: Insufficient documentation

## 2020-09-05 DIAGNOSIS — F10920 Alcohol use, unspecified with intoxication, uncomplicated: Secondary | ICD-10-CM

## 2020-09-05 DIAGNOSIS — S0990XA Unspecified injury of head, initial encounter: Secondary | ICD-10-CM | POA: Diagnosis present

## 2020-09-05 DIAGNOSIS — W19XXXA Unspecified fall, initial encounter: Secondary | ICD-10-CM

## 2020-09-05 DIAGNOSIS — W1789XA Other fall from one level to another, initial encounter: Secondary | ICD-10-CM | POA: Insufficient documentation

## 2020-09-05 DIAGNOSIS — Y92094 Garage of other non-institutional residence as the place of occurrence of the external cause: Secondary | ICD-10-CM | POA: Insufficient documentation

## 2020-09-05 DIAGNOSIS — S2249XA Multiple fractures of ribs, unspecified side, initial encounter for closed fracture: Secondary | ICD-10-CM | POA: Diagnosis present

## 2020-09-05 DIAGNOSIS — S060X0A Concussion without loss of consciousness, initial encounter: Secondary | ICD-10-CM

## 2020-09-05 DIAGNOSIS — Z20822 Contact with and (suspected) exposure to covid-19: Secondary | ICD-10-CM | POA: Diagnosis not present

## 2020-09-05 LAB — URINALYSIS, ROUTINE W REFLEX MICROSCOPIC
Bilirubin Urine: NEGATIVE
Glucose, UA: NEGATIVE mg/dL
Hgb urine dipstick: NEGATIVE
Ketones, ur: NEGATIVE mg/dL
Leukocytes,Ua: NEGATIVE
Nitrite: NEGATIVE
Protein, ur: NEGATIVE mg/dL
Specific Gravity, Urine: 1.018 (ref 1.005–1.030)
pH: 5 (ref 5.0–8.0)

## 2020-09-05 LAB — CBC
HCT: 44.9 % (ref 39.0–52.0)
Hemoglobin: 15.7 g/dL (ref 13.0–17.0)
MCH: 33.7 pg (ref 26.0–34.0)
MCHC: 35 g/dL (ref 30.0–36.0)
MCV: 96.4 fL (ref 80.0–100.0)
Platelets: 276 10*3/uL (ref 150–400)
RBC: 4.66 MIL/uL (ref 4.22–5.81)
RDW: 12 % (ref 11.5–15.5)
WBC: 8.2 10*3/uL (ref 4.0–10.5)
nRBC: 0 % (ref 0.0–0.2)

## 2020-09-05 LAB — I-STAT CHEM 8, ED
BUN: 10 mg/dL (ref 6–20)
Calcium, Ion: 1.06 mmol/L — ABNORMAL LOW (ref 1.15–1.40)
Chloride: 104 mmol/L (ref 98–111)
Creatinine, Ser: 1 mg/dL (ref 0.61–1.24)
Glucose, Bld: 108 mg/dL — ABNORMAL HIGH (ref 70–99)
HCT: 46 % (ref 39.0–52.0)
Hemoglobin: 15.6 g/dL (ref 13.0–17.0)
Potassium: 3.7 mmol/L (ref 3.5–5.1)
Sodium: 139 mmol/L (ref 135–145)
TCO2: 23 mmol/L (ref 22–32)

## 2020-09-05 LAB — COMPREHENSIVE METABOLIC PANEL
ALT: 56 U/L — ABNORMAL HIGH (ref 0–44)
AST: 49 U/L — ABNORMAL HIGH (ref 15–41)
Albumin: 3.6 g/dL (ref 3.5–5.0)
Alkaline Phosphatase: 58 U/L (ref 38–126)
Anion gap: 11 (ref 5–15)
BUN: 8 mg/dL (ref 6–20)
CO2: 23 mmol/L (ref 22–32)
Calcium: 8.3 mg/dL — ABNORMAL LOW (ref 8.9–10.3)
Chloride: 104 mmol/L (ref 98–111)
Creatinine, Ser: 0.77 mg/dL (ref 0.61–1.24)
GFR, Estimated: >60 mL/min (ref >60)
Glucose, Bld: 111 mg/dL — ABNORMAL HIGH (ref 70–99)
Potassium: 3.7 mmol/L (ref 3.5–5.1)
Sodium: 138 mmol/L (ref 135–145)
Total Bilirubin: 0.6 mg/dL (ref 0.3–1.2)
Total Protein: 6.9 g/dL (ref 6.5–8.1)

## 2020-09-05 LAB — SAMPLE TO BLOOD BANK

## 2020-09-05 LAB — PROTIME-INR
INR: 1 (ref 0.8–1.2)
Prothrombin Time: 12.6 seconds (ref 11.4–15.2)

## 2020-09-05 LAB — LACTIC ACID, PLASMA: Lactic Acid, Venous: 3.1 mmol/L (ref 0.5–1.9)

## 2020-09-05 LAB — ETHANOL: Alcohol, Ethyl (B): 176 mg/dL — ABNORMAL HIGH (ref <10)

## 2020-09-05 LAB — SARS CORONAVIRUS 2 BY RT PCR (HOSPITAL ORDER, PERFORMED IN ~~LOC~~ HOSPITAL LAB): SARS Coronavirus 2: NEGATIVE

## 2020-09-05 MED ORDER — ONDANSETRON HCL 4 MG/2ML IJ SOLN
4.0000 mg | Freq: Four times a day (QID) | INTRAMUSCULAR | Status: DC | PRN
  Administered 2020-09-05: 4 mg via INTRAVENOUS
  Filled 2020-09-05 (×2): qty 2

## 2020-09-05 MED ORDER — KETOROLAC TROMETHAMINE 30 MG/ML IJ SOLN
30.0000 mg | Freq: Once | INTRAMUSCULAR | Status: AC
  Administered 2020-09-05: 30 mg via INTRAVENOUS
  Filled 2020-09-05: qty 1

## 2020-09-05 MED ORDER — IOHEXOL 300 MG/ML  SOLN
100.0000 mL | Freq: Once | INTRAMUSCULAR | Status: AC | PRN
  Administered 2020-09-05: 100 mL via INTRAVENOUS

## 2020-09-05 MED ORDER — ENOXAPARIN SODIUM 30 MG/0.3ML ~~LOC~~ SOLN
30.0000 mg | Freq: Two times a day (BID) | SUBCUTANEOUS | Status: DC
  Administered 2020-09-06: 30 mg via SUBCUTANEOUS
  Filled 2020-09-05: qty 0.3

## 2020-09-05 MED ORDER — DOCUSATE SODIUM 100 MG PO CAPS
100.0000 mg | ORAL_CAPSULE | Freq: Two times a day (BID) | ORAL | Status: DC
  Administered 2020-09-05 – 2020-09-06 (×2): 100 mg via ORAL
  Filled 2020-09-05 (×2): qty 1

## 2020-09-05 MED ORDER — METHOCARBAMOL 500 MG PO TABS
500.0000 mg | ORAL_TABLET | Freq: Three times a day (TID) | ORAL | Status: DC
  Administered 2020-09-05 (×2): 500 mg via ORAL
  Filled 2020-09-05 (×2): qty 1

## 2020-09-05 MED ORDER — OXYCODONE HCL 5 MG PO TABS
5.0000 mg | ORAL_TABLET | ORAL | Status: DC | PRN
  Administered 2020-09-05 – 2020-09-06 (×3): 5 mg via ORAL
  Filled 2020-09-05 (×3): qty 1

## 2020-09-05 MED ORDER — METOPROLOL TARTRATE 5 MG/5ML IV SOLN: 5.0000 mg | Freq: Four times a day (QID) | INTRAVENOUS | Status: DC | PRN

## 2020-09-05 MED ORDER — ACETAMINOPHEN 325 MG PO TABS
650.0000 mg | ORAL_TABLET | Freq: Four times a day (QID) | ORAL | Status: DC
  Administered 2020-09-05 – 2020-09-06 (×4): 650 mg via ORAL
  Filled 2020-09-05 (×4): qty 2

## 2020-09-05 MED ORDER — KETOROLAC TROMETHAMINE 15 MG/ML IJ SOLN
15.0000 mg | Freq: Four times a day (QID) | INTRAMUSCULAR | Status: DC | PRN
  Administered 2020-09-05 – 2020-09-06 (×2): 15 mg via INTRAVENOUS
  Filled 2020-09-05 (×2): qty 1

## 2020-09-05 MED ORDER — SODIUM CHLORIDE 0.9 % IV SOLN: INTRAVENOUS | Status: DC

## 2020-09-05 MED ORDER — ONDANSETRON 4 MG PO TBDP: 4.0000 mg | ORAL_TABLET | Freq: Four times a day (QID) | ORAL | Status: DC | PRN

## 2020-09-05 NOTE — ED Notes (Signed)
Patient transported to CT 
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
## 2020-09-05 NOTE — ED Triage Notes (Signed)
Patient coming from work site. Patient fell from approximately 12 feet face first onto cement. Coworkers on scene state patient had seizure-like activity immediately after and was unconscious for approximately 6 minutes. Patient confused upon EMS arrival. Initial BP 90/palp. After 500cc NS BP up to 130/84.

## 2020-09-05 NOTE — H&P (Signed)
Jeffrey Padilla is an 50 y.o. male.   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 Scene SBP 90. He was brought in as a level 1 trauma. On arrival GCS was E3V5M6=14. He C/O back pain and R rib pain. Reports he recently had COVID (16d ago).   No family history on file. Social History:  has no history on file for tobacco use, alcohol use, and drug use.  Allergies: Not on File  (Not in a hospital admission)   No results found for this or any previous visit (from the past 48 hour(s)). No results found.  Review of Systems  Constitutional: Negative.   HENT: Negative.   Eyes: Negative.   Respiratory: Negative for shortness of breath.        R rib pain  Cardiovascular: Positive for chest pain.       Above  Gastrointestinal: Negative for abdominal pain.  Endocrine: Negative.   Genitourinary: Negative.   Musculoskeletal: Positive for back pain.  Skin: Negative.   Neurological:       LOC, ?SZ at scene  Hematological: Negative.   Psychiatric/Behavioral: Negative.     Blood pressure (!) 159/97, pulse 76, temperature (!) 96.6 F (35.9 C), temperature source Temporal, resp. rate 18, height 6' (1.829 m), weight 136.1 kg, SpO2 99 %. Physical Exam Constitutional:      General: He is in acute distress.     Appearance: He is obese. He is not diaphoretic.  HENT:     Head: Normocephalic.     Right Ear: External ear normal.     Left Ear: External ear normal.     Nose: Nose normal.     Mouth/Throat:     Mouth: Mucous membranes are moist.  Eyes:     General: No scleral icterus.    Extraocular Movements: Extraocular movements intact.     Pupils: Pupils are equal, round, and reactive to light.  Cardiovascular:     Rate and Rhythm: Normal rate and regular rhythm.     Pulses: Normal pulses.     Heart sounds: Normal heart sounds.  Pulmonary:      Effort: Pulmonary effort is normal. No respiratory distress.     Breath sounds: No wheezing, rhonchi or rales.     Comments: Significant R rib tenderness Chest:     Chest wall: Tenderness present.  Abdominal:     General: There is no distension.     Palpations: Abdomen is soft. There is no mass.     Tenderness: There is no abdominal tenderness. There is no guarding.  Musculoskeletal:        General: No tenderness or deformity. Normal range of motion.     Comments: Mild peripheral edema  Skin:    General: Skin is warm.     Capillary Refill: Capillary refill takes 2 to 3 seconds.  Neurological:     Mental Status: He is alert.     Comments: GCS 14, MAE to command LT grossly intact  Psychiatric:        Mood and Affect: Mood normal.      Assessment/Plan Fall 15 ft  Concussion Right rib fractures 5-7  Admit to MedSurg, trauma service, inpatient.  Multimodal pain control and pulmonary toilet.  TBI team therapies.  I spoke with his 2 daughters at the bedside. The patient and his daughters report he can  take oxycodone. Cannot have morphine as it "makes his heart stop".  Liz Malady, MD 09/05/2020, 3:10 PM

## 2020-09-05 NOTE — Plan of Care (Signed)
°  Problem: Education: °Goal: Knowledge of General Education information will improve °Description: Including pain rating scale, medication(s)/side effects and non-pharmacologic comfort measures °Outcome: Progressing °  °

## 2020-09-05 NOTE — Evaluation (Signed)
Physical Therapy Evaluation Patient Details Name: Jeffrey Padilla MRN: [id number] DOB: 1971-05-27 Today's Date: 09/05/2020   History of Present Illness  Pt adm after 15' fall with LOC x 6 minutes with reported seizure like activity. Head CT negative. Pt with rt rib fx's 5-7. No pneumothorax.  Clinical Impression  Pt presents to PT moving fairly well with slight limitations due to rt rib pain. Pt with dizziness with transitional movements. BP's stable. Educated pt on signs/symptoms of mild brain injury. Will check on tomorrow if pt still here to follow up for dizziness.     Follow Up Recommendations No PT follow up    Equipment Recommendations  None recommended by PT    Recommendations for Other Services       Precautions / Restrictions Precautions Precautions: None      Mobility  Bed Mobility Overal bed mobility: Needs Assistance Bed Mobility: Supine to Sit     Supine to sit: Min assist     General bed mobility comments: Assist to elevate trunk into sitting on stretcher due to narrowness of stretcher prevented pt from rolling and being able to go side to sit    Transfers Overall transfer level: Needs assistance Equipment used: None Transfers: Sit to/from Stand Sit to Stand: Supervision         General transfer comment: supervision for lines only  Ambulation/Gait Ambulation/Gait assistance: Supervision Gait Distance (Feet): 125 Feet Assistive device: None Gait Pattern/deviations: Step-through pattern;Wide base of support Gait velocity: decr Gait velocity interpretation: 1.31 - 2.62 ft/sec, indicative of limited community ambulator General Gait Details: steady gait. Supervision for lines  Stairs            Wheelchair Mobility    Modified Rankin (Stroke Patients Only)       Balance Overall balance assessment: No apparent balance deficits (not formally assessed)                                           Pertinent Vitals/Pain  Pain Assessment: Faces Faces Pain Scale: Hurts even more Pain Location: rt ribs with mobility, head Pain Descriptors / Indicators: Aching Pain Intervention(s): Monitored during session;Limited activity within patient's tolerance;Repositioned    Home Living Family/patient expects to be discharged to:: Private residence Living Arrangements: Spouse/significant other;Children Available Help at Discharge: Family;Available 24 hours/day Type of Home: House Home Access: Stairs to enter Entrance Stairs-Rails: None Entrance Stairs-Number of Steps: 2 Home Layout: One level Home Equipment: None      Prior Function Level of Independence: Independent         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, counselling        Extremity/Trunk Assessment   Upper Extremity Assessment Upper Extremity Assessment: Defer to OT evaluation    Lower Extremity Assessment Lower Extremity Assessment: Overall WFL for tasks assessed       Communication   Communication: No difficulties  Cognition Arousal/Alertness: Awake/alert Behavior During Therapy: WFL for tasks assessed/performed Overall Cognitive Status: Within Functional Limits for tasks assessed                                        General Comments      Exercises     Assessment/Plan    PT Assessment Patient needs continued PT services  PT Problem List Pain       PT Treatment Interventions Gait training;Patient/family education;Functional mobility training;Therapeutic activities    PT Goals (Current goals can be found in the Care Plan section)  Acute Rehab PT Goals Patient Stated Goal: return home PT Goal Formulation: With patient Time For Goal Achievement: 09/08/20 Potential to Achieve Goals: Good    Frequency Min 3X/week   Barriers to discharge        Co-evaluation               AM-PAC PT "6 Clicks" Mobility  Outcome Measure Help needed turning from your back to your side  while in a flat bed without using bedrails?: None Help needed moving from lying on your back to sitting on the side of a flat bed without using bedrails?: A Little Help needed moving to and from a bed to a chair (including a wheelchair)?: None Help needed standing up from a chair using your arms (e.g., wheelchair or bedside chair)?: None Help needed to walk in hospital room?: None Help needed climbing 3-5 steps with a railing? : None 6 Click Score: 23    End of Session   Activity Tolerance: Patient tolerated treatment well Patient left: Other (comment);in bed (stretcher in ED) Nurse Communication: Mobility status (sent secure chat) PT Visit Diagnosis: Difficulty in walking, not elsewhere classified (R26.2);Pain Pain - Right/Left: Right Pain - part of body:  (ribs)    Time: 1720-1740 PT Time Calculation (min) (ACUTE ONLY): 20 min   Charges:   PT Evaluation $PT Eval Low Complexity: 1 Low          [facility] PT Acute Rehabilitation Services Pager [phone] Office [phone]   Angelina Ok [facility] 09/05/2020, 6:10 PM

## 2020-09-05 NOTE — Progress Notes (Signed)
RT NOTES: Level 1 trauma BIB EMS. Airway intact, RT not needed at this time.

## 2020-09-06 ENCOUNTER — Observation Stay (HOSPITAL_COMMUNITY): Payer: PRIVATE HEALTH INSURANCE

## 2020-09-06 LAB — CBC
HCT: 43.1 % (ref 39.0–52.0)
Hemoglobin: 14.6 g/dL (ref 13.0–17.0)
MCH: 32.9 pg (ref 26.0–34.0)
MCHC: 33.9 g/dL (ref 30.0–36.0)
MCV: 97.1 fL (ref 80.0–100.0)
Platelets: 272 10*3/uL (ref 150–400)
RBC: 4.44 MIL/uL (ref 4.22–5.81)
RDW: 12.3 % (ref 11.5–15.5)
WBC: 9.2 10*3/uL (ref 4.0–10.5)
nRBC: 0 % (ref 0.0–0.2)

## 2020-09-06 LAB — BASIC METABOLIC PANEL
Anion gap: 9 (ref 5–15)
BUN: 9 mg/dL (ref 6–20)
CO2: 24 mmol/L (ref 22–32)
Calcium: 8.5 mg/dL — ABNORMAL LOW (ref 8.9–10.3)
Chloride: 105 mmol/L (ref 98–111)
Creatinine, Ser: 0.64 mg/dL (ref 0.61–1.24)
GFR, Estimated: >60 mL/min (ref >60)
Glucose, Bld: 101 mg/dL — ABNORMAL HIGH (ref 70–99)
Potassium: 3.9 mmol/L (ref 3.5–5.1)
Sodium: 138 mmol/L (ref 135–145)

## 2020-09-06 LAB — HIV ANTIBODY (ROUTINE TESTING W REFLEX): HIV Screen 4th Generation wRfx: NONREACTIVE

## 2020-09-06 MED ORDER — ALBUTEROL SULFATE (2.5 MG/3ML) 0.083% IN NEBU: 2.5000 mg | INHALATION_SOLUTION | RESPIRATORY_TRACT | Status: DC | PRN

## 2020-09-06 MED ORDER — METHOCARBAMOL 500 MG PO TABS
1000.0000 mg | ORAL_TABLET | Freq: Three times a day (TID) | ORAL | Status: DC
  Administered 2020-09-06 (×2): 1000 mg via ORAL
  Filled 2020-09-06 (×3): qty 2

## 2020-09-06 MED ORDER — TRAMADOL HCL 50 MG PO TABS: 50.0000 mg | ORAL_TABLET | Freq: Four times a day (QID) | ORAL | Status: DC | PRN

## 2020-09-06 MED ORDER — SODIUM CHLORIDE 0.9% FLUSH
3.0000 mL | Freq: Two times a day (BID) | INTRAVENOUS | Status: DC
  Administered 2020-09-06: 3 mL via INTRAVENOUS

## 2020-09-06 MED ORDER — TRAMADOL HCL 50 MG PO TABS: 50.0000 mg | ORAL_TABLET | Freq: Four times a day (QID) | ORAL | 0 refills | Status: DC | PRN

## 2020-09-06 MED ORDER — CELECOXIB 100 MG PO CAPS
100.0000 mg | ORAL_CAPSULE | Freq: Two times a day (BID) | ORAL | Status: DC
  Administered 2020-09-06: 100 mg via ORAL
  Filled 2020-09-06 (×2): qty 1

## 2020-09-06 MED ORDER — SODIUM CHLORIDE 0.9 % IV SOLN: 250.0000 mL | INTRAVENOUS | Status: DC | PRN

## 2020-09-06 MED ORDER — ESCITALOPRAM OXALATE 10 MG PO TABS: 10.0000 mg | ORAL_TABLET | Freq: Every day | ORAL | Status: DC

## 2020-09-06 MED ORDER — ALBUTEROL SULFATE HFA 108 (90 BASE) MCG/ACT IN AERS: 2.0000 | INHALATION_SPRAY | RESPIRATORY_TRACT | Status: DC | PRN

## 2020-09-06 MED ORDER — PANTOPRAZOLE SODIUM 40 MG PO TBEC
40.0000 mg | DELAYED_RELEASE_TABLET | Freq: Every day | ORAL | Status: DC
  Administered 2020-09-06: 40 mg via ORAL
  Filled 2020-09-06: qty 1

## 2020-09-06 MED ORDER — METHOCARBAMOL 500 MG PO TABS: 1000.0000 mg | ORAL_TABLET | Freq: Three times a day (TID) | ORAL | 0 refills | Status: DC | PRN

## 2020-09-06 MED ORDER — LIDOCAINE 5 % EX PTCH
1.0000 | MEDICATED_PATCH | CUTANEOUS | Status: DC
  Administered 2020-09-06: 1 via TRANSDERMAL
  Filled 2020-09-06: qty 1

## 2020-09-06 MED ORDER — SODIUM CHLORIDE 0.9% FLUSH: 3.0000 mL | INTRAVENOUS | Status: DC | PRN

## 2020-09-06 MED ORDER — LIDOCAINE 5 % EX PTCH: 1.0000 | MEDICATED_PATCH | CUTANEOUS | 0 refills | Status: DC

## 2020-09-06 NOTE — TOC CAGE-AID Note (Signed)
Transition of Care [facility]) - CAGE-AID Screening   Patient Details  Name: Jeffrey Padilla MRN: [id number] Date of Birth: Dec 17, 1970  Transition of Care [facility]) CM/SW Contact:    Emeterio Reeve, [place] Phone Number: 09/06/2020, 4:04 PM   Clinical Narrative:  CSW met with pt at bedside. CSW introduced self and explained role at the hospital.  Pt reports alcohol use of a few nights a week after work. Pt scored a 0 on the assessment.  Pt denies substance use. Pt did not need any resources at this time.    CAGE-AID Screening:    Have You Ever Felt You Ought to Cut Down on Your Drinking or Drug Use?: No Have People Annoyed You By Critizing Your Drinking Or Drug Use?: No Have You Felt Bad Or Guilty About Your Drinking Or Drug Use?: No Have You Ever Had a Drink or Used Drugs First Thing In The Morning to Steady Your Nerves or to Get Rid of a Hangover?: No CAGE-AID Score: 0  Substance Abuse Education Offered: Yes    Blima Ledger, [place] Social Worker [phone]

## 2020-09-06 NOTE — Progress Notes (Signed)
Occupational Therapy Evaluation  Wife able to assist pt as needed with ADL tasks. Complaining of R shoulder pain at times and dizziness with head movement - likely BPPV. Educated on signs/symptoms of concussion and need for follow up if symptoms do not improve or pt has difficulty with work duties. Pt/wife verbalized understanding.     09/06/20 1400  OT Visit Information  Last OT Received On 09/06/20  Assistance Needed +1  History of Present Illness Pt is a 50 y.o. male admitted 09/05/20 after fall from 23' with LOC for ~6-minutes and reported seizure-like activity. Head CT negative for acute injury. Pt sustained R rib 5-7 fxs. PMH includes depression.  Precautions  Precautions Fall;Other (comment)  Precaution Comments complaints of vertigo  Restrictions  Weight Bearing Restrictions No  Home Living  Family/patient expects to be discharged to: Private residence  Living Arrangements Spouse/significant other  Available Help at Discharge Family;Available 24 hours/day  Type of Home House  Home Access Stairs to enter  Entrance Stairs-Number of Steps 2  Entrance Stairs-Rails None  Home Layout One level  Scientist, physiological Yes  How Accessible Accessible via walker  Home Equipment None  Prior Function  Level of Independence Independent  Comments Works as IT sales professional and in [employer]  Pain Assessment  Pain Assessment 0-10  Pain Score 5  Pain Location back, side, head  Pain Descriptors / Indicators Discomfort  Pain Intervention(s) Limited activity within patient's tolerance  Cognition  Arousal/Alertness Awake/alert  Behavior During Therapy WFL for tasks assessed/performed  Overall Cognitive Status Impaired/Different from baseline  Area of Impairment Attention;Memory  Current Attention Level Selective  Memory Decreased short-term memory  General Comments dealyed recall after 5 min 4/5 words. Wife states he didn;t  know where he was last night; states he seems "dazed"  Upper Extremity Assessment  Upper Extremity Assessment RUE deficits/detail  RUE Deficits / Details AROM grossly WFL. complains of soreness with movement and scapular pain  Lower Extremity Assessment  Lower Extremity Assessment Defer to PT evaluation  Cervical / Trunk Assessment  Cervical / Trunk Assessment Other exceptions (increased body habitus; rib fxs)  ADL  Overall ADL's  Needs assistance/impaired  Functional mobility during ADLs Modified independent  General ADL Comments Able to complete UB ADL with set up and LB ADL wtih min A. Educated pt/wife on compensatory techniques to reduce pain with UB ADL; wife can assit wtih LB ADL due topaina dn dizziness associated with head movement forward  Vision- History  Baseline Vision/History Wears glasses  Wears Glasses At all times  Vision- Assessment  Additional Comments complains of blurred vision with head movemetn; +nystagmus  Bed Mobility  Overal bed mobility Modified Independent (but painful; educated of "splinting" with use of pillow and maintaining focal point to reduce dizziness)  Transfers  Overall transfer level Modified independent  General Comments  General comments (skin integrity, edema, etc.) wife presetn; Educate 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 Wife declined written information  OT - End of Session  Activity Tolerance Patient tolerated treatment well  Patient left in bed;with call bell/phone within reach;with family/visitor present  Nurse Communication Mobility status  OT Assessment  OT Recommendation/Assessment Patient does not need any further OT services  OT Visit Diagnosis Unsteadiness on feet (R26.81);Pain;Other symptoms and signs involving cognitive function  Pain - Right/Left Right  Pain - part of body Shoulder (side/back)  OT Problem List Decreased activity tolerance;Impaired vision/perception;Decreased  safety  awareness;Decreased cognition;Decreased knowledge of use of DME or AE;Obesity  AM-PAC OT "6 Clicks" Daily Activity Outcome Measure (Version 2)  Help from another person eating meals? 4  Help from another person taking care of personal grooming? 3  Help from another person toileting, which includes using toliet, bedpan, or urinal? 3  Help from another person bathing (including washing, rinsing, drying)? 3  Help from another person to put on and taking off regular upper body clothing? 3  Help from another person to put on and taking off regular lower body clothing? 3  6 Click Score 19  OT Recommendation  Follow Up Recommendations No OT follow up;Supervision - Intermittent  OT Equipment None recommended by OT  Acute Rehab OT Goals  Patient Stated Goal return home  OT Goal Formulation With patient/family  OT Time Calculation  OT Start Time (ACUTE ONLY) 1058  OT Stop Time (ACUTE ONLY) 1120  OT Time Calculation (min) 22 min  OT General Charges  $OT Visit 1 Visit  OT Evaluation  $OT Eval Low Complexity 1 Low  Written Expression  Dominant Hand Right  Luisa Dago, OT/L   Acute OT Clinical Specialist Acute Rehabilitation Services Pager [phone] Office [phone]

## 2020-09-06 NOTE — Progress Notes (Addendum)
Physical Therapy Treatment Patient Details Name: Jeffrey Padilla MRN: [id number] DOB: 08-03-1971 Today's Date: 09/06/2020    History of Present Illness Pt is a 50 y.o. male admitted 09/05/20 after fall from 1' with LOC for ~6-minutes and reported seizure-like activity. Head CT negative for acute injury. Pt sustained R rib 5-7 fxs. PMH includes depression.    PT Comments    Pt admitted with above diagnosis. Pt was able to tolerate BPPV treatment for right posterior canal BPPV.  Pt nauseated after but dizziness was better.  Wife and pt want to proceed with going home and he may borrow a RW initially.  Issued gait belt to pt and [employer] exercise. Discussed compensation strategies as well.  Pt to follow up with Outpt PT if dizziness is persistent.  Should progress well.  Pt currently with functional limitations due to vertigo  deficits. Pt will benefit from skilled PT to increase their independence and safety with mobility to allow discharge to the venue listed below.     Follow Up Recommendations  Outpatient PT;Supervision/Assistance - 24 hour (for Vestibular Rehab)     Equipment Recommendations  Other (comment) (issued gait belt and to borrow RW)    Recommendations for Other Services       Precautions / Restrictions Precautions Precautions: Fall;Other (comment) Precaution Comments: Dizziness with position changes and head turns Restrictions Weight Bearing Restrictions: No    Mobility  Bed Mobility Overal bed mobility: Modified Independent Bed Mobility: Rolling;Sidelying to Sit Rolling: Supervision;Min guard Sidelying to sit: Min assist;Min guard       General bed mobility comments: Educ on log roll technique, pt able to perform with bed flat, increased time and effort  Transfers Overall transfer level: Needs assistance Equipment used: None Transfers: Sit to/from Stand Sit to Stand: Supervision         General transfer comment: Guarded due to pain; c/o dizziness  worse upon standing persisting due to just performed canalith repositioning.  Ambulation/Gait Ambulation/Gait assistance: Supervision Gait Distance (Feet): 35 Feet Assistive device: Rolling walker (2 wheeled) Gait Pattern/deviations: Step-through pattern;Wide base of support Gait velocity: Decreased Gait velocity interpretation: 1.31 - 2.62 ft/sec, indicative of limited community ambulator General Gait Details: Steady gait, guarded with pain and some dizziness, pt to borrow RW from pts wifes mom Discussed compensation strategies as well.   Stairs             Wheelchair Mobility    Modified Rankin (Stroke Patients Only)       Balance Overall balance assessment: Needs assistance Sitting-balance support: No upper extremity supported;Feet supported Sitting balance-Leahy Scale: Good     Standing balance support: No upper extremity supported;During functional activity Standing balance-Leahy Scale: Good Standing balance comment: No LOB with some challenges.                            Cognition Arousal/Alertness: Awake/alert Behavior During Therapy: WFL for tasks assessed/performed Overall Cognitive Status: Within Functional Limits for tasks assessed                                        Exercises Other Exercises Other Exercises: Educated pt and wife regarding [employer] exercise and pt practiced it.  Handout [id number] given to pt from [employer]    General Comments General comments (skin integrity, edema, etc.): Wife present.  Performed BPPV testing with pt  positive for right posterior canal BPPV and performed canalith repositioning maneuver.  Pt with nausea after treatment therefore nurse brought nausea meds for pt.  Dizziness was not present when pt laid back down so hopeful that treatment was successful.      Pertinent Vitals/Pain Pain Assessment: 0-10 Pain Score: 3  Faces Pain Scale: Hurts even more Pain Location: back, side,  head Pain Descriptors / Indicators: Discomfort Pain Intervention(s): Limited activity within patient's tolerance;Monitored during session;Repositioned    Home Living     Available Help at Discharge: Family;Available 24 hours/day Type of Home: House              Prior Function            PT Goals (current goals can now be found in the care plan section) Acute Rehab PT Goals Patient Stated Goal: return home Additional Goals Additional Goal #2: Pt to have negative BPPV testing all canals. Progress towards PT goals: Progressing toward goals    Frequency    Min 3X/week      PT Plan Current plan remains appropriate    Co-evaluation              AM-PAC PT "6 Clicks" Mobility   Outcome Measure  Help needed turning from your back to your side while in a flat bed without using bedrails?: None Help needed moving from lying on your back to sitting on the side of a flat bed without using bedrails?: None Help needed moving to and from a bed to a chair (including a wheelchair)?: A Little Help needed standing up from a chair using your arms (e.g., wheelchair or bedside chair)?: A Little Help needed to walk in hospital room?: A Little Help needed climbing 3-5 steps with a railing? : A Little 6 Click Score: 20    End of Session Equipment Utilized During Treatment: Gait belt Activity Tolerance: Patient tolerated treatment well;Patient limited by pain Patient left: with call bell/phone within reach;with family/visitor present;in bed Nurse Communication: Mobility status PT Visit Diagnosis: Difficulty in walking, not elsewhere classified (R26.2);Pain Pain - Right/Left: Right Pain - part of body:  (ribs)     Time: 5625-6389 PT Time Calculation (min) (ACUTE ONLY): 31 min  Charges:  $Gait Training: 8-22 mins $Therapeutic Activity: 8-22 mins $Canalith Rep Proc: 8-22 mins                     Newel Oien W,PT Acute Rehabilitation Services Pager:  [phone]  Office:   [phone]     Berline Lopes 09/06/2020, 12:09 PM

## 2020-09-06 NOTE — Progress Notes (Signed)
Progress Note     Subjective: Patient complains of R sided back pain. Some nausea with oxycodone, thinks he has tolerated tramadol previously. Takes celebrex at home regularly. Patient denies abdominal pain. Denies extremity pain, numbness or weakness. Pulling IS to top.  Objective: Vital signs in last 24 hours: Temp:  [96.6 F (35.9 C)-98.3 F (36.8 C)] 98.3 F (36.8 C) (02/01 0352) Pulse Rate:  [72-86] 72 (02/01 0352) Resp:  [14-22] 19 (02/01 0352) BP: (118-159)/(54-100) 122/55 (02/01 0352) SpO2:  [93 %-100 %] 97 % (02/01 0352) Weight:  [136.1 kg] 136.1 kg (01/31 1455) Last BM Date: 09/05/20  Intake/Output from previous day: 01/31 0701 - 02/01 0700 In: 1489.7 [P.O.:400; I.V.:1089.7] Out: -  Intake/Output this shift: No intake/output data recorded.  PE: General: pleasant, WD, obese male who is laying in bed in NAD HEENT: head is normocephalic, atraumatic.  Sclera are noninjected.  PERRL.  Ears and nose without any masses or lesions.  Mouth is pink and moist Heart: regular, rate, and rhythm.  Normal s1,s2. No obvious murmurs, gallops, or rubs noted.  Palpable radial and pedal pulses bilaterally Lungs: CTAB, no wheezes, rhonchi, or rales noted.  Respiratory effort nonlabored, ttp of R chest wall Abd: soft, NT, ND, +BS, no masses, hernias, or organomegaly MS: all 4 extremities are symmetrical with no cyanosis, clubbing, or edema. Skin: warm and dry with no masses, lesions, or rashes Neuro: Cranial nerves 2-12 grossly intact, sensation is normal throughout Psych: A&Ox3 with an appropriate affect.    Lab Results:  Recent Labs    09/05/20 1450 09/05/20 1517 09/06/20 0551  WBC 8.2  --  9.2  HGB 15.7 15.6 14.6  HCT 44.9 46.0 43.1  PLT 276  --  272   BMET Recent Labs    09/05/20 1450 09/05/20 1517 09/06/20 0551  NA 138 139 138  K 3.7 3.7 3.9  CL 104 104 105  CO2 23  --  24  GLUCOSE 111* 108* 101*  BUN 8 10 9   CREATININE 0.77 1.00 0.64  CALCIUM 8.3*  --  8.5*    PT/INR Recent Labs    09/05/20 1450  LABPROT 12.6  INR 1.0   CMP     Component Value Date/Time   NA 138 09/06/2020 0551   K 3.9 09/06/2020 0551   CL 105 09/06/2020 0551   CO2 24 09/06/2020 0551   GLUCOSE 101 (H) 09/06/2020 0551   BUN 9 09/06/2020 0551   CREATININE 0.64 09/06/2020 0551   CALCIUM 8.5 (L) 09/06/2020 0551   PROT 6.9 09/05/2020 1450   ALBUMIN 3.6 09/05/2020 1450   AST 49 (H) 09/05/2020 1450   ALT 56 (H) 09/05/2020 1450   ALKPHOS 58 09/05/2020 1450   BILITOT 0.6 09/05/2020 1450   GFRNONAA >60 09/06/2020 0551   Lipase  No results found for: LIPASE     Studies/Results: CT HEAD WO CONTRAST  Result Date: 09/05/2020 CLINICAL DATA:  50 year old male with fall. EXAM: CT HEAD WITHOUT CONTRAST CT CERVICAL SPINE WITHOUT CONTRAST TECHNIQUE: Multidetector CT imaging of the head and cervical spine was performed following the standard protocol without intravenous contrast. Multiplanar CT image reconstructions of the cervical spine were also generated. COMPARISON:  Head dated 03/24/2018. FINDINGS: CT HEAD FINDINGS Brain: The ventricles and sulci appropriate size for patient's age. The gray-white matter discrimination is preserved. There is no acute intracranial hemorrhage. No mass effect midline shift no extra-axial fluid collection. Vascular: No hyperdense vessel or unexpected calcification. Skull: Normal. Negative for fracture or focal  lesion. Sinuses/Orbits: No acute finding. Other: None CT CERVICAL SPINE FINDINGS Alignment: Skull base and vertebrae: No acute fracture. Soft tissues and spinal canal: No prevertebral fluid or swelling. No visible canal hematoma. Disc levels:  No acute findings.  Mild degenerative changes. Upper chest: Negative. Other: None IMPRESSION: 1. No acute intracranial pathology. 2. No acute/traumatic cervical spine pathology. Electronically Signed   By: Elgie Collard M.D.   On: 09/05/2020 15:56   CT CHEST W CONTRAST  Result Date: 09/05/2020 CLINICAL  DATA:  50 year old male with fall and chest trauma. EXAM: CT CHEST, ABDOMEN, AND PELVIS WITH CONTRAST TECHNIQUE: Multidetector CT imaging of the chest, abdomen and pelvis was performed following the standard protocol during bolus administration of intravenous contrast. CONTRAST:  OMNIPAQUE IOHEXOL 300 MG/ML  SOLN COMPARISON:  Chest CT dated 06/01/2011. FINDINGS: CT CHEST FINDINGS Cardiovascular: There is no cardiomegaly or pericardial effusion. The thoracic aorta and central pulmonary arteries appear unremarkable. The origins of the great vessels of the aortic arch appear patent. Mediastinum/Nodes: No hilar or mediastinal adenopathy. Right hilar calcified granuloma. The esophagus and the thyroid gland are grossly unremarkable. No mediastinal fluid collection. Lungs/Pleura: No focal consolidation, pleural effusion, pneumothorax. The central airways are patent. Musculoskeletal: Acute nondisplaced fractures of the lateral right fifth-seventh ribs. There is degenerative changes of the spine. CT ABDOMEN PELVIS FINDINGS No intra-abdominal free air or free fluid. Hepatobiliary: The liver is unremarkable. No intrahepatic dilatation. No calcified gallstone or pericholecystic fluid. Apparent focal thickening of the gallbladder fundus, likely adenomyomatosis. Pancreas: Unremarkable. No pancreatic ductal dilatation or surrounding inflammatory changes. Spleen: Normal in size without focal abnormality. Adrenals/Urinary Tract: Indeterminate 2 cm right adrenal nodule. The left adrenal gland is unremarkable. There is no hydronephrosis on either side. There is symmetric enhancement and excretion of contrast by both kidneys. Subcentimeter right renal hypodense lesions too small to characterize. Visualized ureters and urinary bladder appear unremarkable. Stomach/Bowel: There is no bowel obstruction or active inflammation. The appendix is normal. Vascular/Lymphatic: The abdominal aorta and IVC unremarkable. No portal venous gas.  There is no adenopathy. Reproductive: Prostate seminal vesicles are grossly unremarkable. No pelvic masses Other: None Musculoskeletal: Degenerative changes of the spine. Grade 1 L5-S1 retrolisthesis. No acute osseous pathology IMPRESSION: 1. Acute nondisplaced fractures of the lateral right fifth-seventh ribs. No pneumothorax. 2. No acute/traumatic intra-abdominal or pelvic pathology. 3. Indeterminate 2 cm right adrenal nodule. These results were called by telephone at the time of interpretation on 09/05/2020 at 3:46 pm to provider [facility] , who verbally acknowledged these results. Electronically Signed   By: Elgie Collard M.D.   On: 09/05/2020 15:47   CT CERVICAL SPINE WO CONTRAST  Result Date: 09/05/2020 CLINICAL DATA:  50 year old male with fall. EXAM: CT HEAD WITHOUT CONTRAST CT CERVICAL SPINE WITHOUT CONTRAST TECHNIQUE: Multidetector CT imaging of the head and cervical spine was performed following the standard protocol without intravenous contrast. Multiplanar CT image reconstructions of the cervical spine were also generated. COMPARISON:  Head dated 03/24/2018. FINDINGS: CT HEAD FINDINGS Brain: The ventricles and sulci appropriate size for patient's age. The gray-white matter discrimination is preserved. There is no acute intracranial hemorrhage. No mass effect midline shift no extra-axial fluid collection. Vascular: No hyperdense vessel or unexpected calcification. Skull: Normal. Negative for fracture or focal lesion. Sinuses/Orbits: No acute finding. Other: None CT CERVICAL SPINE FINDINGS Alignment: Skull base and vertebrae: No acute fracture. Soft tissues and spinal canal: No prevertebral fluid or swelling. No visible canal hematoma. Disc levels:  No acute findings.  Mild degenerative  changes. Upper chest: Negative. Other: None IMPRESSION: 1. No acute intracranial pathology. 2. No acute/traumatic cervical spine pathology. Electronically Signed   By: Elgie Collard M.D.   On: 09/05/2020  15:56   CT ABDOMEN PELVIS W CONTRAST  Result Date: 09/05/2020 CLINICAL DATA:  50 year old male with fall and chest trauma. EXAM: CT CHEST, ABDOMEN, AND PELVIS WITH CONTRAST TECHNIQUE: Multidetector CT imaging of the chest, abdomen and pelvis was performed following the standard protocol during bolus administration of intravenous contrast. CONTRAST:  OMNIPAQUE IOHEXOL 300 MG/ML  SOLN COMPARISON:  Chest CT dated 06/01/2011. FINDINGS: CT CHEST FINDINGS Cardiovascular: There is no cardiomegaly or pericardial effusion. The thoracic aorta and central pulmonary arteries appear unremarkable. The origins of the great vessels of the aortic arch appear patent. Mediastinum/Nodes: No hilar or mediastinal adenopathy. Right hilar calcified granuloma. The esophagus and the thyroid gland are grossly unremarkable. No mediastinal fluid collection. Lungs/Pleura: No focal consolidation, pleural effusion, pneumothorax. The central airways are patent. Musculoskeletal: Acute nondisplaced fractures of the lateral right fifth-seventh ribs. There is degenerative changes of the spine. CT ABDOMEN PELVIS FINDINGS No intra-abdominal free air or free fluid. Hepatobiliary: The liver is unremarkable. No intrahepatic dilatation. No calcified gallstone or pericholecystic fluid. Apparent focal thickening of the gallbladder fundus, likely adenomyomatosis. Pancreas: Unremarkable. No pancreatic ductal dilatation or surrounding inflammatory changes. Spleen: Normal in size without focal abnormality. Adrenals/Urinary Tract: Indeterminate 2 cm right adrenal nodule. The left adrenal gland is unremarkable. There is no hydronephrosis on either side. There is symmetric enhancement and excretion of contrast by both kidneys. Subcentimeter right renal hypodense lesions too small to characterize. Visualized ureters and urinary bladder appear unremarkable. Stomach/Bowel: There is no bowel obstruction or active inflammation. The appendix is normal.  Vascular/Lymphatic: The abdominal aorta and IVC unremarkable. No portal venous gas. There is no adenopathy. Reproductive: Prostate seminal vesicles are grossly unremarkable. No pelvic masses Other: None Musculoskeletal: Degenerative changes of the spine. Grade 1 L5-S1 retrolisthesis. No acute osseous pathology IMPRESSION: 1. Acute nondisplaced fractures of the lateral right fifth-seventh ribs. No pneumothorax. 2. No acute/traumatic intra-abdominal or pelvic pathology. 3. Indeterminate 2 cm right adrenal nodule. These results were called by telephone at the time of interpretation on 09/05/2020 at 3:46 pm to provider [facility] , who verbally acknowledged these results. Electronically Signed   By: Elgie Collard M.D.   On: 09/05/2020 15:47   DG Pelvis Portable  Result Date: 09/05/2020 CLINICAL DATA:  Larey Seat 12 feet. EXAM: PORTABLE PELVIS 1-2 VIEWS COMPARISON:  None. FINDINGS: Iliac crests are not included. Other than that, no evidence of fracture or focal finding in the region. IMPRESSION: Negative. Electronically Signed   By: Paulina Fusi M.D.   On: 09/05/2020 15:11   DG Chest Port 1 View  Result Date: 09/06/2020 CLINICAL DATA:  Multiple rib fractures. EXAM: PORTABLE CHEST 1 VIEW COMPARISON:  CT 09/05/2020.  Chest x-ray 09/05/2020 FINDINGS: Mediastinum and hilar structures normal. Persistent low lung volumes with mild right base subsegmental atelectasis. No pleural effusion or pneumothorax. Right rib fractures best identified by prior CT. IMPRESSION: 1. Persistent low lung volumes with mild right base subsegmental atelectasis. No pneumothorax. 2. Right rib fractures best identified by prior CT. No pneumothorax. Electronically Signed   By: Maisie Fus  Register   On: 09/06/2020 05:17   DG Chest Port 1 View  Result Date: 09/05/2020 CLINICAL DATA:  Larey Seat 12 feet from a roof and struck head. EXAM: PORTABLE CHEST 1 VIEW COMPARISON:  06/01/2011. FINDINGS: The lungs are clear without focal pneumonia, edema,  pneumothorax or pleural effusion. The cardiopericardial silhouette is within normal limits for size. The visualized bony structures of the thorax show no acute abnormality. Telemetry leads overlie the chest. IMPRESSION: No active disease. Electronically Signed   By: Kennith Center M.D.   On: 09/05/2020 15:11    Anti-infectives: Anti-infectives (From admission, onward)   None       Assessment/Plan Fall 15 ft Concussion - SLP eval  R 5-7 rib fractures - CXR stable this AM without PTX, pain control, IS, pulm toilet Depression - home lexapro ordered  FEN: reg diet VTE: lovenox ID: no current abx indicated  Dispo: Pain control, OT and SLP still to see. Likely discharge this afternoon  LOS: 0 days    Juliet Rude , [facility] 09/06/2020, 9:53 AM Please see Amion for pager number during day hours 7:00am-4:30pm

## 2020-09-06 NOTE — Evaluation (Signed)
Speech Language Pathology Evaluation Patient Details Name: Jeffrey Padilla MRN: [id number] DOB: Apr 23, 1971 Today's Date: 09/06/2020 Time: 3212-2482 SLP Time Calculation (min) (ACUTE ONLY): 20 min  Problem List:  Patient Active Problem List   Diagnosis Date Noted  . Multiple rib fractures 09/05/2020   Past Medical History: No past medical history on file.  HPI:  50yo male admitted 09/05/20 after falling ~15 feet, hitting his head and exhibiting seizure like activity. PMH unremarkable.   Assessment / Plan / Recommendation Clinical Impression  The [employer] Mental Status Examination (SLUMS) was administered. Pt scored 25/30 (n=27+/30). Points were lost on delayed recall (4/5 unrelated words recalled) and clock drawing task. Pt wrote 12/12 numbers around the outside of the circle, and set the hands for five minutes to two (pt was instructed to set hands at ten after 11). This raises concern for higher level executive function deficits. Given level of independence prior to admission, outpatient speech therapy is recommended to address high level cognitive deficits to facilitate return to previous level of function and minimize caregiver burden.    SLP Assessment  SLP Recommendation/Assessment: All further Speech Language Pathology needs can be addressed in the next venue of care  SLP Visit Diagnosis: Cognitive communication deficit (R41.841)    Follow Up Recommendations  Outpatient SLP       SLP Evaluation Cognition  Overall Cognitive Status: Impaired/Different from baseline Arousal/Alertness: Awake/alert Orientation Level: Oriented X4 Attention: Focused;Sustained Focused Attention: Appears intact Sustained Attention: Appears intact Memory: Impaired Memory Impairment: Retrieval deficit;Decreased short term memory Decreased Short Term Memory: Verbal basic Executive Function:  (difficulty noted on clock drawing task)       Comprehension  Auditory Comprehension Overall  Auditory Comprehension: Appears within functional limits for tasks assessed    Expression Expression Primary Mode of Expression: Verbal Verbal Expression Overall Verbal Expression: Appears within functional limits for tasks assessed Written Expression Dominant Hand: Right   Oral / Motor  Oral Motor/Sensory Function Overall Oral Motor/Sensory Function: Within functional limits Motor Speech Overall Motor Speech: Appears within functional limits for tasks assessed Intelligibility: Intelligible   GO                   Celia B. Murvin Natal, [facility], CCC-SLP Speech Language Pathologist Office: [phone]  Leigh Aurora 09/06/2020, 11:00 AM

## 2020-09-06 NOTE — Discharge Summary (Signed)
Physician Discharge Summary  Patient ID: GURNOOR URSUA MRN: [id number] DOB/AGE: 1971-07-22 50 y.o.  Admit date: 09/05/2020 Discharge date: 09/06/2020  Discharge Diagnoses Patient Active Problem List   Diagnosis Date Noted  . 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 On the scene SBP 90. He was brought in as a level 1 trauma. On arrival GCS was [id number]=14. He complained of back pain and right rib pain. Reported he recently had COVID (16d ago). Workup in the ED revealed concussion and right rib fractures.  Hospital Course: Patient was admitted for observation to the trauma service. Patient was evaluated by therapies who recommended outpatient PT/OT/SLP with vestibular rehab. Follow up CXR remained stable. Patient discharged in stable condition on HD#1 with follow up as outlined below.   I or a member of my team have reviewed this patient in the Controlled Substance Database   Allergies as of 09/06/2020      Reactions   Morphine And Related Other (See Comments)   Unconsciousness   Penicillins Rash      Medication List    TAKE these medications   acetaminophen 500 MG tablet Commonly known as: TYLENOL Take 500-1,000 mg by mouth every 6 (six) hours as needed for mild pain or fever.   albuterol 108 (90 Base) MCG/ACT inhaler Commonly known as: VENTOLIN HFA Inhale 2 puffs into the lungs every 4 (four) hours as needed for wheezing or shortness of breath. prn   celecoxib 100 MG capsule Commonly known as: CELEBREX Take 100 mg by mouth 2 (two) times daily.   escitalopram 10 MG tablet Commonly known as: LEXAPRO Take 10 mg by mouth at bedtime.   esomeprazole 20 MG capsule Commonly known as: NEXIUM Take 20 mg by mouth daily before  breakfast.   lidocaine 5 % Commonly known as: LIDODERM Place 1 patch onto the skin daily. Remove & Discard patch within 12 hours or as directed by MD Start taking on: September 07, 2020   methocarbamol 500 MG tablet Commonly known as: ROBAXIN Take 2 tablets (1,000 mg total) by mouth every 8 (eight) hours as needed for muscle spasms.   ONE-A-DAY M[employer] FORMULA PO Take 1 tablet by mouth daily with breakfast.   Pulmicort Flexhaler 180 MCG/ACT inhaler Generic drug: budesonide Inhale 3-4 puffs into the lungs at bedtime.   traMADol 50 MG tablet Commonly known as: ULTRAM Take 1-2 tablets (50-100 mg total) by mouth every 6 (six) hours as needed for severe pain or moderate pain.         Follow-up Information    [place].   Specialty: Family Medicine Why: Call and schedule a follow up appointment for 1-2 weeks for assistance with further pain control for rib fractures and management of post-concussive syndrome. Contact information: [address] [place] [address] [phone]        [facility] Follow up.   Why: No follow up scheduled, but may call as needed with questions/concerns.  Contact information: [address] [place] [address] [phone]              Signed: Juliet Rude , [facility] 09/06/2020, 2:58 PM Please see Amion  for pager number during day hours 7:00am-4:30pm

## 2020-09-06 NOTE — Discharge Instructions (Signed)
Post-Concussion Syndrome  A concussion is a brain injury. Post-concussion syndrome is when symptoms last longer than normal after a head injury. What are the causes? The cause of this condition is not known. It can happen if your head injury was mild or very bad. What increases the risk?  Being male.  Being young.  Having had a head injury before.  Being sad (depressed) or feeling worried or nervous (having anxiety).  Fainting when you got your concussion or not being able to remember it.  Having many symptoms or very bad symptoms at the time of your injury. What are the signs or symptoms? After a head injury, you may have physical symptoms, such as:  Having headaches.  Feeling tired.  Feeling dizzy.  Feeling weak.  Having trouble seeing.  Having trouble in bright lights.  Having trouble hearing.  Having problems balancing. You may also have mental and emotional symptoms, such as:  Not being able to remember things.  Not being able to focus.  Having trouble sleeping.  Feeling grouchy (irritable).  Feeling worried.  Feeling sad.  Having trouble learning new things. These can last from weeks to months. How is this treated? Treatment for this condition may depend on your symptoms. Symptoms usually go away on their own over time. If you need treatment, it may include:  Taking medicines.  Resting your brain and body for a few days.  Doing therapy to help you recover (rehabilitation therapy). This may include: ? Physical or occupational therapy. This may include exercises. ? Talking to a counselor. ? Speech therapy. ? Therapy to help your eyes. Follow these instructions at home: Medicines  Take all medicines only as told by your doctor.  Avoid pain medicines that have opioids in them. Ask your doctor which pain medicine to take. Activity  Limit activities as told by your doctor. This may include not doing the following: ? Homework. ? Job-related  work. ? Hard thinking. ? Watching TV. ? Using a computer or phone. ? Puzzles. ? Exercise. ? Sports.  Slowly return to your normal activity as told by your doctor.  Stop an activity if you have symptoms.  Rest. Try to: ? Sleep 7-9 hours each night. ? Take naps or breaks when you feel tired during the day.  Do not do anything that may cause you to get hurt again right away. General instructions  Do not drink alcohol until your doctor says that you can.  Keep track of your symptoms.  Keep all follow-up visits as told by your doctor. This is important.   Contact a doctor if:  You do not improve.  You get worse.  You have another injury. Get help right away if:  You have a very bad headache or a headache that gets worse.  You feel confused.  You feel very sleepy.  You faint.  You vomit.  You feel weak in any part of your body.  You feel numb in any part of your body.  You start shaking (have a seizure).  You have trouble talking. Summary  This condition is when symptoms last longer than normal after a head injury.  Limit your activities after your injury. Slowly return to normal activities as told by your doctor.  Rest.  Do not drink alcohol and avoid pain medicines that have opioids in them if able  Call your doctor if your symptoms get worse. This information is not intended to replace advice given to you by your health care provider. Make  sure you discuss any questions you have with your health care provider. Document Revised: 07/15/2019 Document Reviewed: 07/15/2019 Elsevier Patient Education  2021 [employer] Inc[employer]   Rib Fracture  A rib fracture is a break or crack in one of the bones of the ribs. The ribs are like a cage that goes around your upper chest. A broken or cracked rib is often painful, but most do not cause other problems. Most rib fractures usually heal on their own in 1-3 months. What are the causes?  Doing movements over and over  again with a lot of force, such as pitching a baseball or having a very bad cough.  A direct hit to the chest.  Cancer that has spread to the bones. What are the signs or symptoms?  Pain when you breathe in or cough.  Pain when someone presses on the injured area.  Feeling short of breath. How is this treated? Treatment depends on how bad the fracture is. In general:  Most rib fractures usually heal on their own in 1-3 months.  Healing may take longer if you have a cough or are doing activities that make the injury worse.  While you heal, you may be given medicines to control pain.  You will also be taught deep breathing exercises.  Very bad injuries may require a stay at the hospital or surgery. Follow these instructions at home: Managing pain, stiffness, and swelling  If told, put ice on the injured area. To do this: ? Put ice in a plastic bag. ? Place a towel between your skin and the bag. ? Leave the ice on for 20 minutes, 2-3 times a day. ? Take off the ice if your skin turns bright red. This is very important. If you cannot feel pain, heat, or cold, you have a greater risk of damage to the area.  Take over-the-counter and prescription medicines only as told by your doctor. Activity  Avoid activities that cause pain to the injured area. Protect your injured area.  Slowly increase activity as told by your doctor. General instructions  Do deep breathing exercises as told by your doctor. You may be told to: ? Take deep breaths many times a day. ? Cough several times a day while hugging a pillow. ? Use a device (incentive spirometer) to do deep breathing many times a day.  Drink enough fluid to keep your pee (urine) clear or pale yellow.  Do not wear a rib belt or binder.  Keep all follow-up visits. Contact a doctor if:  You have a fever. Get help right away if:  You have trouble breathing.  You are short of breath.  You cannot stop coughing.  You cough  up thick or bloody spit.  You feel like you may vomit (nauseous), vomit, or have belly (abdominal) pain.  Your pain gets worse and medicine does not help. These symptoms may be an emergency. Get help right away. Call your local emergency services (911 in the [place]  Do not wait to see if the symptoms will go away.  Do not drive yourself to the hospital. Summary  A rib fracture is a break or crack in one of the bones of the ribs.  Apply ice to the injured area and take medicines for pain as told by your doctor.  Take deep breaths and cough several times a day. Hug a pillow every time you cough. This information is not intended to replace advice given to you by your health care provider.  Make sure you discuss any questions you have with your health care provider. Document Revised: 11/13/2019 Document Reviewed: 11/13/2019 Elsevier Patient Education  2021 [employer].

## 2020-09-06 NOTE — Plan of Care (Signed)
°  Problem: Education: °Goal: Knowledge of General Education information will improve °Description: Including pain rating scale, medication(s)/side effects and non-pharmacologic comfort measures °Outcome: Adequate for Discharge °  °Problem: Health Behavior/Discharge Planning: °Goal: Ability to manage health-related needs will improve °Outcome: Adequate for Discharge °  °Problem: Clinical Measurements: °Goal: Ability to maintain clinical measurements within normal limits will improve °Outcome: Adequate for Discharge °Goal: Will remain free from infection °Outcome: Adequate for Discharge °Goal: Diagnostic test results will improve °Outcome: Adequate for Discharge °Goal: Respiratory complications will improve °Outcome: Adequate for Discharge °Goal: Cardiovascular complication will be avoided °Outcome: Adequate for Discharge °  °Problem: Activity: °Goal: Risk for activity intolerance will decrease °Outcome: Adequate for Discharge °  °Problem: Nutrition: °Goal: Adequate nutrition will be maintained °Outcome: Adequate for Discharge °  °Problem: Coping: °Goal: Level of anxiety will decrease °Outcome: Adequate for Discharge °  °Problem: Elimination: °Goal: Will not experience complications related to bowel motility °Outcome: Adequate for Discharge °Goal: Will not experience complications related to urinary retention °Outcome: Adequate for Discharge °  °Problem: Pain Managment: °Goal: General experience of comfort will improve °Outcome: Adequate for Discharge °  °Problem: Safety: °Goal: Ability to remain free from injury will improve °Outcome: Adequate for Discharge °  °Problem: Skin Integrity: °Goal: Risk for impaired skin integrity will decrease °Outcome: Adequate for Discharge °  °

## 2020-09-06 NOTE — Progress Notes (Signed)
Physical Therapy Treatment Patient Details Name: Jeffrey Padilla MRN: [id number] DOB: 1971/03/23 Today's Date: 09/06/2020    History of Present Illness Pt is a 50 y.o. male admitted 09/05/20 after fall from 71' with LOC for ~6-minutes and reported seizure-like activity. Head CT negative for acute injury. Pt sustained R rib 5-7 fxs. PMH includes depression.   PT Comments    Pt progressing with mobility. Today's session focused on mobility training while maintaining abdominal precautions 'for comfort' due to significant R-side rib and scapular pain. Pt with persistent dizziness, which sounds related to vestibular-type issue(?) potentially from head trauma with fall; acute vestibular PT consulted for visit prior to d/c today (PA aware). Wife present and supportive.   Follow Up Recommendations  No PT follow up (acute vesibular PT consult)     Equipment Recommendations  None recommended by PT    Recommendations for Other Services       Precautions / Restrictions Precautions Precautions: Fall;Other (comment) Precaution Comments: Dizziness with position changes and head turns (BP cuff not working for orthostatic measurements, but sounds more vestibular-related than BP); abdominal precautions for comfort Restrictions Weight Bearing Restrictions: No    Mobility  Bed Mobility Overal bed mobility: Modified Independent Bed Mobility: Rolling;Sidelying to Sit Rolling: Modified independent (Device/Increase time) Sidelying to sit: Modified independent (Device/Increase time)       General bed mobility comments: Educ on log roll technique, pt able to perform with bed flat; mod indep with use of bed rail, increased time  Transfers Overall transfer level: Needs assistance Equipment used: None Transfers: Sit to/from Stand Sit to Stand: Supervision         General transfer comment: Guarded due to pain; c/o dizziness worse upon standing but quickly improving once  upright  Ambulation/Gait Ambulation/Gait assistance: Supervision Gait Distance (Feet): 10 Feet Assistive device: None Gait Pattern/deviations: Step-through pattern;Wide base of support Gait velocity: Decreased   General Gait Details: Steady gait, guarded with pain; supervision for safety/lines   Stairs             Wheelchair Mobility    Modified Rankin (Stroke Patients Only)       Balance Overall balance assessment: Needs assistance   Sitting balance-Leahy Scale: Good       Standing balance-Leahy Scale: Good                              Cognition Arousal/Alertness: Awake/alert Behavior During Therapy: WFL for tasks assessed/performed Overall Cognitive Status: Within Functional Limits for tasks assessed                                        Exercises Other Exercises Other Exercises: IS x5 - pt with good technique, pulling ~2500 mL    General Comments General comments (skin integrity, edema, etc.): Wife present and supportive. Educ re: precautions and positioning for comfort, concussion symptom reduction (i.e. decreased screen time), potential vestibular impairment (to have vestibular PT see him before d/c today)      Pertinent Vitals/Pain Pain Assessment: Faces Faces Pain Scale: Hurts even more Pain Location: R-side ribs and R scapula Pain Descriptors / Indicators: Grimacing;Guarding;Discomfort Pain Intervention(s): Limited activity within patient's tolerance;Monitored during session;Other (comment);RN gave pain meds during session (educ on back/abdominal precautions for comfort, including bracing for cough/etc.)    Home Living  Prior Function            PT Goals (current goals can now be found in the care plan section) Progress towards PT goals: Progressing toward goals    Frequency    Min 3X/week      PT Plan Current plan remains appropriate    Co-evaluation               AM-PAC PT "6 Clicks" Mobility   Outcome Measure  Help needed turning from your back to your side while in a flat bed without using bedrails?: None Help needed moving from lying on your back to sitting on the side of a flat bed without using bedrails?: None Help needed moving to and from a bed to a chair (including a wheelchair)?: A Little Help needed standing up from a chair using your arms (e.g., wheelchair or bedside chair)?: A Little Help needed to walk in hospital room?: A Little Help needed climbing 3-5 steps with a railing? : A Little 6 Click Score: 20    End of Session   Activity Tolerance: Patient tolerated treatment well;Patient limited by pain Patient left: in chair;with call bell/phone within reach;with family/visitor present Nurse Communication: Mobility status PT Visit Diagnosis: Difficulty in walking, not elsewhere classified (R26.2);Pain Pain - Right/Left: Right     Time: 2595-6387 PT Time Calculation (min) (ACUTE ONLY): 21 min  Charges:  $Therapeutic Activity: 8-22 mins                    Ina Homes, PT, DPT Acute Rehabilitation Services  Pager [phone] Office [phone]  Malachy Chamber 09/06/2020, 10:43 AM

## 2020-09-13 ENCOUNTER — Encounter: Payer: Self-pay | Admitting: Physical Medicine & Rehabilitation

## 2020-09-13 ENCOUNTER — Other Ambulatory Visit: Payer: Self-pay

## 2020-09-13 ENCOUNTER — Ambulatory Visit (HOSPITAL_COMMUNITY): Payer: PRIVATE HEALTH INSURANCE | Attending: Physician Assistant | Admitting: Physical Therapy

## 2020-09-13 ENCOUNTER — Encounter (HOSPITAL_COMMUNITY): Payer: Self-pay | Admitting: Physical Therapy

## 2020-09-13 DIAGNOSIS — H8112 Benign paroxysmal vertigo, left ear: Secondary | ICD-10-CM | POA: Insufficient documentation

## 2020-09-13 NOTE — Therapy (Signed)
[facility] [address] [place], [place], [address] Phone: [phone]   Fax:  [phone]  Physical Therapy Evaluation  Patient Details  Name: MURLIN BROWN MRN: [id number] Date of Birth: [birth year] Referring Provider (PT): Trixie Deis   Encounter Date: 09/13/2020   PT End of Session - 09/13/20 1429    Visit Number 1    Number of Visits 8    Date for PT Re-Evaluation 10/13/20    Authorization Type medcost    PT Start Time 1320    PT Stop Time 1420    PT Time Calculation (min) 60 min    Activity Tolerance Patient tolerated treatment well    Behavior During Therapy [facility] for tasks assessed/performed           Past Medical History:  Diagnosis Date  . Abdominal pain   . Acid reflux     Past Surgical History:  Procedure Laterality Date  . 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 INCISION AND DRAINAGE Left 06/11/2017   Procedure: INCISION AND DRAINAGE POSTOPERATIVE WOUND LEFT HEEL;  Surgeon: Sheral Apley, MD;  Location: [facility];  Service: Orthopedics;  Laterality: Left;  . PLANTAR FASCIECTOMY  2012  . 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    Subjective Assessment - 09/13/20 1338    Subjective Mr. Brixey comes to the department walking with a rolling walker.  He has dizziness when he turns his head or goes to lie down.  He states that this is the primary reason that he is using a rolling walker at this time.  He sees a neurologist next Tuesday.    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 On the scene SBP 90. He was brought in as a level 1 trauma on Jan 31s. On arrival GCS was [id number]=14. He complained of back pain and right rib pain. Reported he recently had COVID (16d ago). Workup in the ED revealed concussion and right rib fractures discharged on 09/06/20.  Pt recieved and tolerated treatment for right posterior canal.    Currently in Pain? No/denies    Pain Score 3     Pain Location Rib cage    Pain Orientation Right    Pain Descriptors / Indicators Penetrating    Pain Type Acute pain    Pain Radiating Towards Rt side    Pain Frequency Constant    Aggravating Factors  activity    Multiple Pain Sites Yes    Pain Score 5    Pain Location Head    Pain Orientation Right;Lateral    Pain Descriptors / Indicators Aching    Pain Radiating Towards 50% throbbing    Pain Onset 1 to 4 weeks ago    Pain Frequency Intermittent    Aggravating Factors  actvity    Pain Relieving Factors rest              [facility] PT Assessment - 09/13/20 0001      Assessment   Medical Diagnosis Rt posterior  BPPV    Referring Provider (PT) Trixie Deis    Onset Date/Surgical Date 09/05/20    Next MD Visit 10/18/2020    Prior Therapy acute      Precautions   Precautions Fall      Restrictions   Weight Bearing Restrictions No      Balance Screen   Has the patient fallen in the past 6 months Yes    How many times? 1    Has the patient had a decrease in activity level because of a fear of falling?  Yes    Is the patient reluctant to leave their home because of a fear of falling?  No      Home Tourist information centre manager residence      Prior Function   Level of Independence Independent      Cognition   Overall Cognitive Status Within Functional Limits for tasks assessed                  Vestibular Assessment - 09/13/20 0001      Symptom Behavior   Subjective history of current problem Pt had a fall on 09/05/2020    Type  of Dizziness  Spinning    Frequency of Dizziness 15-20    Duration of Dizziness 1-2 minutes    Symptom Nature Motion provoked    Aggravating Factors Comment   Turning head to the left     Oculomotor Exam   Smooth Pursuits Intact    Saccades Intact      Oculomotor Exam-Fixation Suppressed    Ocular ROM normal      Vestibulo-Ocular Reflex   VOR 1 Head Only (x 1 viewing) normal    VOR 2 Head and Object (x 2 viewing) normal      Positional Testing   Dix-Hallpike Dix-Hallpike Left      Positional Sensitivities   Head Turning x 5 Mild dizziness   To Lt normal to RT   Head Nodding x 5 No dizziness    Rolling Left Severe dizziness              Objective measurements completed on examination: See above findings.       [facility] Adult PT Treatment/Exercise - 09/13/20 0001      Exercises   Exercises Neck      Neck Exercises: Seated   Other Seated Exercise rotation  x 5   explained to work on at home get up to 15 with not dizziness then incease velocity.          Vestibular Treatment/Exercise - 09/13/20 0001      Vestibular Treatment/Exercise   Vestibular Treatment Provided Canalith Repositioning    Canalith Repositioning Epley Manuever Left       EPLEY MANUEVER LEFT   Number of Reps  2    Overall Response  Improved Symptoms     RESPONSE DETAILS LEFT initally had nystagmus second time none                 PT Education - 09/13/20 1428    Education Details What crystals in the ear are, what causes BPPV, how it is improved with Eplys    Person(s) Educated Spouse;Caregiver(s)    Methods Explanation;Demonstration;Verbal cues    Comprehension Verbalized understanding;Returned demonstration            PT Short Term Goals - 09/13/20 1437      PT SHORT TERM GOAL #1   Title Pt to be completing  exercises to reduce sx of dizziness.    Time 1    Period Weeks    Status New    Target Date 09/20/20      PT SHORT TERM GOAL #2   Title Pt to state that he is having  8 or less bouts of dizziness per day.    Time 2    Period Weeks    Status New    Target Date 09/27/20      PT SHORT TERM GOAL #3   Title PT serverity of dizziness to have decreased 50% to allow pt to feel confident walking with a cane.    Time 2    Period Weeks    Status New             PT Long Term Goals - 09/13/20 1439      PT LONG TERM GOAL #1   Title Pt to have had no sx of dizziness for the past week    Time 4    Period Weeks    Status New    Target Date 10/11/20      PT LONG TERM GOAL #2   Title Pt to be ambulating without any assistive device    Time 4    Period Weeks    Status New      PT LONG TERM GOAL #3   Title PT to feel confident driving    Time 4    Period Weeks                  Plan - 09/13/20 1430    Clinical Impression Statement Mr. Margraf is a 50 yo male who fell from a garage sustaining a head trauma, fx ribs, Lt shoulder injury and dizziness.  He is currently being referred to outpatient physical therapy for his dizziness.  Evaluation demonstrates Lt posterior canal BPPV.  Both pt and wife state that in the hospital it was turning his head to the right that caused his dizziness but since he has been home it has been his Lt.  This occurs approximately 15-20 x a day.  Mr. Nordling was unable to complete the Piedad Climes halpike due to his rib and shoulder injury, therefore Eply manuever was completed to the Lt based on subjective complaints with overall improvement.  PT will benefit from skilled PT for Eply's manuever as indicated, balance and improve mobility as pt is currently walking with a walker and was ambulating without assistive device working in [employer].    Personal Factors and Comorbidities Fitness    Examination-Activity Limitations Bed Mobility;Bend;Carry;Dressing;Lift;Locomotion Level;Stand;Stairs;Squat    Examination-Participation Restrictions Cleaning;Driving;Meal Prep;Occupation;Yard Work;Volunteer    Stability/Clinical Decision Making  Evolving/Moderate complexity    Clinical Decision Making Moderate    Rehab Potential Good    PT Frequency 2x / week    PT Duration 4 weeks    PT Treatment/Interventions Patient/family education;Therapeutic activities;Functional mobility training;Gait training;Therapeutic exercise;Balance training;Manual techniques    PT Next Visit Plan Test cervical ROM, single leg balance, DGI with cane if able; see if pt can tolerate dixhal-pike manuever if not complete Eply's based on subjective complaints.    PT Home Exercise Plan Cervical rotation.           Patient will benefit from skilled therapeutic intervention in order to improve the following deficits and impairments:  Abnormal gait,Decreased activity tolerance,Decreased balance,Dizziness  Visit Diagnosis: BPPV (benign paroxysmal positional vertigo), left     Problem List Patient Active Problem List   Diagnosis Date  Noted  . Multiple rib fractures 09/05/2020  . Intractable headache 05/22/2018  . Head injuries, initial encounter 05/22/2018  . Post concussion syndrome 05/22/2018  . Behavioral change 05/22/2018  . Neck pain 05/22/2018  . Left foot infection 06/11/2017   Virgina Organ, PT CLT [phone] 09/13/2020, 2:43 PM  [facility] [address] [place], [place], [address] Phone: [phone]   Fax:  [phone]  Name: ZACKERY BRINE MRN: [id number] Date of Birth: April 12, 1971

## 2020-09-20 ENCOUNTER — Encounter: Payer: Self-pay | Admitting: Neurology

## 2020-09-20 ENCOUNTER — Ambulatory Visit (INDEPENDENT_AMBULATORY_CARE_PROVIDER_SITE_OTHER): Payer: PRIVATE HEALTH INSURANCE | Admitting: Neurology

## 2020-09-20 VITALS — BP 134/89 | HR 66 | Ht 72.0 in | Wt 320.0 lb

## 2020-09-20 DIAGNOSIS — F0781 Postconcussional syndrome: Secondary | ICD-10-CM

## 2020-09-20 DIAGNOSIS — H8110 Benign paroxysmal vertigo, unspecified ear: Secondary | ICD-10-CM | POA: Diagnosis not present

## 2020-09-20 DIAGNOSIS — R561 Post traumatic seizures: Secondary | ICD-10-CM | POA: Diagnosis not present

## 2020-09-20 NOTE — Progress Notes (Signed)
[facility]  PATIENT: Jeffrey Padilla DOB: 1971-03-23  REFERRING DOCTOR OR PCP:  Dr. Vivi Ferns SOURCE: Patient, notes from primary care, imaging reports, CT scan images personally reviewed.  _________________________________   HISTORICAL  CHIEF COMPLAINT:  Chief Complaint  Patient presents with  . New Patient (Initial Visit)    RM 85 with Clydie Braun (wife). Paper referral from [facility], DO for seizure like activity post fall. Happened on 09/05/20. 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 Went to PT for vestibular rehab and this helped with dizziness.     HISTORY OF PRESENT ILLNESS:  I had the pleasure of seeing your patient, Jeffrey Padilla, at [facility] for neurologic consultation regarding his head injury and seizure activity.  I saw him previously in 2019 after another head injury.  He is a 50 year old man who fell 10 feet from a scaffold onto concrete hitting the right frontal vertex.   He was unconscious x 7-8 minutes.   An ambulance arrived and he regained consciousness but was groggy a few hours.  He slightly remembers the ambulance ride.  Right after he fell, coworkers (one an EMT) noted probable seizure activity.    Additionally, in the ambulance, he had another seizure.   He has had no further seizures or spells.     In the ED he had CT of the head and cervical spine that were normal.  He was admitted overnight and released.    He has had mild headaches since the fall but they are improving and are tolerable.   Headaches are a dull ache in the right frontal region and not associated with nausea.    He also had vertigo after the fall that resolved with vestibular therapy.  He had another head injury without LOC in 2019 but followed by grogginess for over a day.  He had post-concussive headaches (much worse than current HA).  When seen, he had an occipital nerve block and was placed on  imipramine with benefit.    He also had some behavioral changes and an EEG was performed 06/04/2018 that was normal.   Imaging review: CT of the head 09/05/2020 was normal.  CT of the cervical spine 09/05/2020 showed no acute findings.  There are mild degenerative changes at C6-C7 greater than C4-C5 and C5-C6 that does not appear to cause spinal stenosis or nerve root compression.  MRI of the brain 06/09/2018 was normal.  REVIEW OF SYSTEMS: Constitutional: No fevers, chills, sweats, or change in appetite Eyes: No visual changes, double vision, eye pain Ear, nose and throat: No hearing loss, ear pain, nasal congestion, sore throat Cardiovascular: No chest pain, palpitations Respiratory: No shortness of breath at rest or with exertion.   No wheezes GastrointestinaI: No nausea, vomiting, diarrhea, abdominal pain, fecal incontinence Genitourinary: No dysuria, urinary retention or frequency.  No nocturia. Musculoskeletal: No neck pain, back pain Integumentary: No rash, pruritus, skin lesions Neurological: as above Psychiatric: No depression at this time.  No anxiety Endocrine: No palpitations, diaphoresis, change in appetite, change in weigh or increased thirst Hematologic/Lymphatic: No anemia, purpura, petechiae. Allergic/Immunologic: No itchy/runny eyes, nasal congestion, recent allergic reactions, rashes  ALLERGIES: Allergies  Allergen Reactions  . Morphine And Related Anaphylaxis    "stops my heart"   . Morphine And Related Other (See Comments)    Unconsciousness   . Tramadol     itch  . Penicillins Rash  As a child   . Penicillins Rash    HOME MEDICATIONS:  Current Outpatient Medications:  .  acetaminophen (TYLENOL) 325 MG tablet, Take 650 mg by mouth every 6 (six) hours as needed., Disp: , Rfl:  .  celecoxib (CELEBREX) 100 MG capsule, Take 100 mg by mouth 2 (two) times daily., Disp: , Rfl:  .  escitalopram (LEXAPRO) 10 MG tablet, Take 10 mg by mouth at bedtime.,  Disp: , Rfl:  .  esomeprazole (NEXIUM) 20 MG capsule, Take 20 mg by mouth daily before breakfast., Disp: , Rfl:  .  Multiple Vitamins-Minerals (ONE-A-DAY [employer] FORMULA PO), Take 1 tablet by mouth daily with breakfast., Disp: , Rfl:   PAST MEDICAL HISTORY: Past Medical History:  Diagnosis Date  . Abdominal pain   . Acid reflux     PAST SURGICAL HISTORY: Past Surgical History:  Procedure Laterality Date  . EXCISION HAGLUND'S DEFORMITY WITH ACHILLES TENDON REPAIR Left 03/21/2017   Procedure: LEFT FOOT PARTIAL CALCANEUS EXCISION , LEFT ACHILLES TENDON REPAIR;  Surgeon: Loreta Ave, MD;  Location: [facility];  Service: Orthopedics;  Laterality: Left;  . INCISION AND DRAINAGE Left 06/11/2017   Procedure: INCISION AND DRAINAGE POSTOPERATIVE WOUND LEFT HEEL;  Surgeon: Sheral Apley, MD;  Location: [facility];  Service: Orthopedics;  Laterality: Left;  . PLANTAR FASCIECTOMY  2012  . thumb surgery  12/99   amputation and reattachment     FAMILY HISTORY: Family History  Problem Relation Age of Onset  . Dementia Mother   . Lung cancer Father   . Stroke Father   . Heart Problems Father     SOCIAL HISTORY:  Social History   Socioeconomic History  . Marital status: Married    Spouse name: Not on file  . Number of children: Not on file  . Years of education: Not on file  . Highest education level: Not on file  Occupational History  . Not on file  Tobacco Use  . Smoking status: Never Smoker  . Smokeless tobacco: Current User    Types: Chew  Substance and Sexual Activity  . Alcohol use: Yes    Comment: occasionally  . Drug use: No  . Sexual activity: Not on file  Other Topics Concern  . Not on file  Social History Narrative   Tea sometimes    Right handed   Social Determinants of Health   Financial Resource Strain: Not on file  Food Insecurity: Not on file  Transportation Needs: Not on file  Physical Activity: Not on file   Stress: Not on file  Social Connections: Not on file  Intimate Partner Violence: Not on file     PHYSICAL EXAM  Vitals:   09/20/20 1056  BP: 134/89  Pulse: 66  Weight: (!) 320 lb (145.2 kg)  Height: 6' (1.829 m)    Body mass index is 43.4 kg/m.   General: The patient is well-developed and well-nourished and in no acute distress  HEENT:  Head is /AT.  Sclera are anicteric.  Funduscopic exam shows normal optic discs and retinal vessels.  Neck: No carotid bruits are noted.  The neck is nontender.  Cardiovascular: The heart has a regular rate and rhythm with a normal S1 and S2. There were no murmurs, gallops or rubs.    Skin: Extremities are without rash or  edema.  Musculoskeletal:  Back is nontender  Neurologic Exam  Mental status: The patient is alert and oriented x 3 at the  time of the examination. The patient has apparent normal recent and remote memory, with an apparently normal attention span and concentration ability.   Speech is normal.  Cranial nerves: Extraocular movements are full. Pupils are equal, round, and reactive to light and accomodation.  Visual fields are full.  Facial symmetry is present. There is good facial sensation to soft touch bilaterally.Facial strength is normal.  Trapezius and sternocleidomastoid strength is normal. No dysarthria is noted.  The tongue is midline, and the patient has symmetric elevation of the soft palate. No obvious hearing deficits are noted.  Motor:  Muscle bulk is normal.   Tone is normal. Strength is  5 / 5 in all 4 extremities.   Sensory: Sensory testing is intact to pinprick, soft touch and vibration sensation in all 4 extremities.  Coordination: Cerebellar testing reveals good finger-nose-finger and heel-to-shin bilaterally.  Gait and station: Station is normal.   Gait is normal. Tandem gait is normal. Romberg is negative.   Reflexes: Deep tendon reflexes are symmetric and normal bilaterally.   Plantar responses are  flexor.    DIAGNOSTIC DATA (LABS, IMAGING, TESTING) - I reviewed patient records, labs, notes, testing and imaging myself where available.  Lab Results  Component Value Date   WBC 9.2 09/06/2020   HGB 14.6 09/06/2020   HCT 43.1 09/06/2020   MCV 97.1 09/06/2020   PLT 272 09/06/2020      Component Value Date/Time   NA 138 09/06/2020 0551   K 3.9 09/06/2020 0551   CL 105 09/06/2020 0551   CO2 24 09/06/2020 0551   GLUCOSE 101 (H) 09/06/2020 0551   BUN 9 09/06/2020 0551   CREATININE 0.64 09/06/2020 0551   CALCIUM 8.5 (L) 09/06/2020 0551   PROT 6.9 09/05/2020 1450   ALBUMIN 3.6 09/05/2020 1450   AST 49 (H) 09/05/2020 1450   ALT 56 (H) 09/05/2020 1450   ALKPHOS 58 09/05/2020 1450   BILITOT 0.6 09/05/2020 1450   GFRNONAA >60 09/06/2020 0551        ASSESSMENT AND PLAN  Post concussion syndrome - Plan: EEG adult  Seizures, post-traumatic (HCC) - Plan: EEG adult  Benign paroxysmal positional vertigo, unspecified laterality   In summary, Mr. Stapel is a 50 year old man who had a head injury from a fall associated with 2 seizures shortly after the fall.  He had associated vertigo that resolved with vestibular therapy.  He also had headaches that have improved spontaneously.  We discussed that because the seizures occurred very shortly after the fall I would not initiate an antiepileptic medication.  However we do need to check an EEG.  If the EEG shows abnormal activity that could lower his seizure threshold or if he has another seizure he would need to go on antiepileptiform medication.  In the interim, reminded him of the [place] state law that he should not drive for 6 months.  We will call him with the results of the EEG.  He will let us know if he has any new or worsening neurologic symptoms.  45-minute office visit with the majority of the time spent face-to-face for history and physical, discussion/counseling and decision-making.  Additional time with record review  and documentation.    Chirstina Haan A. Epimenio Foot, MD, [facility] 09/20/2020, 5:13 PM Certified in Neurology, Clinical Neurophysiology, Sleep Medicine and Neuroimaging  [facility] [address] [place], [place] [address] [phone]

## 2020-10-11 ENCOUNTER — Other Ambulatory Visit: Payer: PRIVATE HEALTH INSURANCE

## 2020-10-17 ENCOUNTER — Other Ambulatory Visit: Payer: Self-pay

## 2020-10-17 ENCOUNTER — Ambulatory Visit (INDEPENDENT_AMBULATORY_CARE_PROVIDER_SITE_OTHER): Payer: PRIVATE HEALTH INSURANCE | Admitting: Neurology

## 2020-10-17 DIAGNOSIS — F0781 Postconcussional syndrome: Secondary | ICD-10-CM

## 2020-10-17 DIAGNOSIS — R561 Post traumatic seizures: Secondary | ICD-10-CM

## 2020-10-17 NOTE — Progress Notes (Signed)
   [facility]  EEG (ELECTROENCEPHALOGRAM) REPORT   STUDY DATE: 10/17/2020 PATIENT NAME: Jeffrey Padilla DOB: 1971/05/03 MRN: [id number]  ORDERING CLINICIAN: Jaanvi Fizer A. Epimenio Foot, MD. PhD  TECHNOLOGIST: Elvis Coil, RPSGT TECHNIQUE: Electroencephalogram was recorded utilizing standard 10-20 system of lead placement and reformatted into average and bipolar montages.  RECORDING TIME: 25 minutes 0 seconds  CLINICAL INFORMATION: 50 year old man with 2 posttraumatic seizures  FINDINGS: A digital EEG was performed while the patient was awake and drowsy. While awake and most alert there was a 10 hz posterior dominant rhythm. Voltages and frequencies were symmetric.  There were no focal, lateralizing, epileptiform activity or seizures seen.  Photic stimulation had a normal driving response. Hyperventilation and recovery did not change the underlying rhythms. EKG channel shows normal sinus rhythm..  He became drowsy but did not enter definitive sleep.  IMPRESSION: This is a normal EEG while the patient was awake and drowsy..   INTERPRETING PHYSICIAN:   Lilian Fuhs A. Epimenio Foot, MD, PhD, [facility] Certified in Neurology, Clinical Neurophysiology, Sleep Medicine, Pain Medicine and Neuroimaging  [facility] [address] [place], [place] [address] [phone]

## 2020-10-18 ENCOUNTER — Encounter: Payer: Self-pay | Admitting: *Deleted

## 2020-10-18 ENCOUNTER — Telehealth: Payer: Self-pay | Admitting: *Deleted

## 2020-10-18 NOTE — Telephone Encounter (Signed)
Called and spoke with pt about EEG results per Dr. Bonnita Hollow note. Pt verbalized understanding. He requested letter be mailed detailing results. I placed letter in mail for pt. Verified address on file.

## 2020-10-18 NOTE — Telephone Encounter (Signed)
-----   Message from Asa Lente, MD sent at 10/17/2020  6:48 PM EDT ----- Regarding: EEG results Please let him know that the EEG was normal.  Therefore, we do not need to start an epilepsy medication

## 2020-10-24 ENCOUNTER — Encounter: Payer: Self-pay | Admitting: Physical Medicine & Rehabilitation

## 2020-10-24 ENCOUNTER — Encounter
Payer: PRIVATE HEALTH INSURANCE | Attending: Physical Medicine & Rehabilitation | Admitting: Physical Medicine & Rehabilitation

## 2020-10-24 ENCOUNTER — Other Ambulatory Visit: Payer: Self-pay

## 2020-10-24 VITALS — BP 129/83 | HR 80 | Temp 97.9°F | Ht 72.0 in | Wt 319.6 lb

## 2020-10-24 DIAGNOSIS — G44319 Acute post-traumatic headache, not intractable: Secondary | ICD-10-CM | POA: Insufficient documentation

## 2020-10-24 DIAGNOSIS — F0781 Postconcussional syndrome: Secondary | ICD-10-CM | POA: Diagnosis present

## 2020-10-24 NOTE — Progress Notes (Signed)
Subjective:    Patient ID: Jeffrey Padilla, male    DOB: Feb 24, 1971, 50 y.o.   MRN: [id number]  HPI Male with past medical history/past surgical history of GERD, left thumb surgery, plantar fasciotomy, left Achilles tendon repair presents with postconcussive syndrome.  Wife supplements history. ED and neurology notes reviewed.  Patient noted to have posttraumatic seizures x2, EEG unremarkable and antiepileptics not initiated. Started on 09/05/2020. Fell ~60ft, on the right side of his head and body.  Improving.  Dizziness, headaches, nausea, confusion.  Cannot identify exacerbating or alleviating factors.  Intermittent.  Denies falls. Denies associated weakness/numbness.  Vestibular therapy with improvement. Tylenol with benefit.  Denies limiting factors. Patient and family reports no records of seizures only witnessed by bystander as "seizure like activity", but ?uncomfirmed. No tongue biting, incontinence, post-ictal state.   Pain Inventory Average Pain 5 Pain Right Now 5 My pain is intermittent, dull and aching  LOCATION OF PAIN  Right Knee, Lower Back, Headaches   BOWEL Number of stools per week: 7 Oral laxative use No  Type of laxative None Enema or suppository use No  History of colostomy No  Incontinent No   BLADDER Normal In and out cath, frequency N/A Able to self cath No  Bladder incontinence No  Frequent urination No  Leakage with coughing No  Difficulty starting stream No  Incomplete bladder emptying No    Mobility ability to climb steps?  yes do you drive?  no  Function employed # of hrs/week On medical leave  Neuro/Psych No problems in this area  Prior Studies Any changes since last visit?  yes x-rays Brain Study  Physicians involved in your care Any changes since last visit?  no New Patient   Family History  Problem Relation Age of Onset  . Dementia Mother   . Lung cancer Father   . Stroke Father   . Heart Problems Father    Social History    Socioeconomic History  . Marital status: Married    Spouse name: Not on file  . Number of children: Not on file  . Years of education: Not on file  . Highest education level: Not on file  Occupational History  . Not on file  Tobacco Use  . Smoking status: Never Smoker  . Smokeless tobacco: Current User    Types: Chew  Vaping Use  . Vaping Use: Never used  Substance and Sexual Activity  . Alcohol use: Yes    Comment: occasionally  . Drug use: No  . Sexual activity: Not on file  Other Topics Concern  . Not on file  Social History Narrative   Tea sometimes    Right handed   Social Determinants of Health   Financial Resource Strain: Not on file  Food Insecurity: Not on file  Transportation Needs: Not on file  Physical Activity: Not on file  Stress: Not on file  Social Connections: Not on file   Past Surgical History:  Procedure Laterality Date  . EXCISION HAGLUND'S DEFORMITY WITH ACHILLES TENDON REPAIR Left 03/21/2017   Procedure: LEFT FOOT PARTIAL CALCANEUS EXCISION , LEFT ACHILLES TENDON REPAIR;  Surgeon: Loreta Ave, MD;  Location: [facility];  Service: Orthopedics;  Laterality: Left;  . INCISION AND DRAINAGE Left 06/11/2017   Procedure: INCISION AND DRAINAGE POSTOPERATIVE WOUND LEFT HEEL;  Surgeon: Sheral Apley, MD;  Location: [facility];  Service: Orthopedics;  Laterality: Left;  . PLANTAR FASCIECTOMY  2012  . 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 Abdominal pain   . Acid reflux    BP 129/83   Pulse 80   Temp 97.9 F (36.6 C)   Ht 6' (1.829 m)   Wt (!) 319 lb 9.6 oz (145 kg)   SpO2 97%   BMI 43.35 kg/m   Opioid Risk Score:   Fall Risk Score:  `1  Depression screen PHQ 2/9  Depression screen PHQ 2/9 10/24/2020  Decreased Interest 0  Down, Depressed, Hopeless 0  PHQ - 2 Score 0  Altered sleeping 0  Tired, decreased energy 1  Change in appetite 0   Feeling bad or failure about yourself  0  Trouble concentrating 0  Moving slowly or fidgety/restless 0  Suicidal thoughts 0  PHQ-9 Score 1   Review of Systems  Musculoskeletal: Positive for back pain and gait problem.       RIGHT Knee Pain  Neurological: Positive for headaches.  All other systems reviewed and are negative.      Objective:   Physical Exam Constitutional: No distress . Vital signs reviewed. HENT: Normocephalic.  Atraumatic. Eyes: EOMI. No discharge. Cardiovascular: No JVD.   Respiratory: Normal effort.  No stridor.   GI: Non-distended.   Skin: Warm and dry.  Intact. Psych: Normal mood.  Normal behavior. Musc: No edema in extremities.  No tenderness in extremities. Neuro: Alert Motor: 5/5 throughout Attention/Concentration: Intact Recall: 5/5 after 5 min    Assessment & Plan:  Male with past medical history/past surgical history of GERD, left thumb surgery, plantar fasciotomy, left Achilles tendon repair presents with postconcussive syndrome.   1. Post concussive syndrome  Main deficits - Dizziness, headaches, nausea, confusion.    CT head unremarkable  Labs reviewed  Chart/Referral information reviewed - post concussive syndrome  PMAWARE reviewed  Completed Vestibular PT with benefit  Continue Tylenol  Referral to Psychology  Does not appear to have had seizures after review of Neurology notes as well as history  FMLA paperwork filled out   2. Morbid Obesity  Encouraged weight loss  > 45 minutes spent with reviewing images, labs, discussing with patient and wife symptoms, driving, return to work, Catering manager

## 2020-11-09 ENCOUNTER — Telehealth: Payer: Self-pay | Admitting: Physical Medicine & Rehabilitation

## 2020-11-09 NOTE — Telephone Encounter (Signed)
lvm for Jeffrey Padilla, we do not have any openings in Dr Allena Katz Schedule prior to the 18th that the patient is requesting, we could perhaps complete ppwk for the patient, and I have added pt to wait list however Dr Allena Katz does not have opening prior to when patient is scheduled.

## 2020-11-10 ENCOUNTER — Other Ambulatory Visit: Payer: Self-pay

## 2020-11-10 ENCOUNTER — Encounter: Payer: Self-pay | Admitting: Physical Medicine & Rehabilitation

## 2020-11-10 ENCOUNTER — Encounter
Payer: PRIVATE HEALTH INSURANCE | Attending: Physical Medicine & Rehabilitation | Admitting: Physical Medicine & Rehabilitation

## 2020-11-10 VITALS — BP 152/84 | HR 82 | Temp 98.0°F | Ht 72.0 in | Wt 319.0 lb

## 2020-11-10 DIAGNOSIS — F0781 Postconcussional syndrome: Secondary | ICD-10-CM | POA: Diagnosis present

## 2020-11-10 NOTE — Progress Notes (Signed)
Subjective:    Patient ID: Jeffrey Padilla, male    DOB: [birth year], 50 y.o.   MRN: [id number]  HPI Male with past medical history/past surgical history of GERD, left thumb surgery, plantar fasciotomy, left Achilles tendon repair presents with postconcussive syndrome.   Initially stated: Wife supplements history. ED and neurology notes reviewed.  Patient noted to have posttraumatic seizures x2, EEG unremarkable and antiepileptics not initiated. Started on 09/05/2020. Fell ~21ft, on the right side of his head and body.  Improving.  Dizziness, headaches, nausea, confusion.  Cannot identify exacerbating or alleviating factors.  Intermittent.  Denies falls. Denies associated weakness/numbness.  Vestibular therapy with improvement. Tylenol with benefit.  Denies limiting factors. Patient and family reports no records of seizures only witnessed by bystander as "seizure like activity", but ?uncomfirmed. No tongue biting, incontinence, post-ictal state.   Last clinic visit on 10/24/2020.  Since that time, patient states dizziness, headaches, nausea, confusion has resolved. He states there is no documentation for seizures and brings in records for review. He would like to return to work.  He was released by Neurology and neg EEG.   Pain Inventory Average Pain 3 Pain Right Now 2 My pain is dull  LOCATION OF PAIN  Right Knee, Lower Back, Headaches   BOWEL Number of stools per week: 7 Oral laxative use No  Type of laxative None Enema or suppository use No  History of colostomy No  Incontinent No   BLADDER Normal In and out cath, frequency N/A Able to self cath No  Bladder incontinence No  Frequent urination No  Leakage with coughing No  Difficulty starting stream No  Incomplete bladder emptying No    Mobility ability to climb steps?  yes do you drive?  no  Function employed # of hrs/week On medical leave  Neuro/Psych No problems in this area  Prior Studies Any changes since last  visit?  yes x-rays Brain Study  Physicians involved in your care Any changes since last visit?  no New Patient   Family History  Problem Relation Age of Onset  . Dementia Mother   . Lung cancer Father   . Stroke Father   . Heart Problems Father    Social History   Socioeconomic History  . Marital status: Married    Spouse name: Not on file  . Number of children: Not on file  . Years of education: Not on file  . Highest education level: Not on file  Occupational History  . Not on file  Tobacco Use  . Smoking status: Never Smoker  . Smokeless tobacco: Current User    Types: Chew  Vaping Use  . Vaping Use: Never used  Substance and Sexual Activity  . Alcohol use: Yes    Comment: occasionally  . Drug use: No  . Sexual activity: Not on file  Other Topics Concern  . Not on file  Social History Narrative   Tea sometimes    Right handed   Social Determinants of Health   Financial Resource Strain: Not on file  Food Insecurity: Not on file  Transportation Needs: Not on file  Physical Activity: Not on file  Stress: Not on file  Social Connections: Not on file   Past Surgical History:  Procedure Laterality Date  . EXCISION HAGLUND'S DEFORMITY WITH ACHILLES TENDON REPAIR Left 03/21/2017   Procedure: LEFT FOOT PARTIAL CALCANEUS EXCISION , LEFT ACHILLES TENDON REPAIR;  Surgeon: Loreta Ave, MD;  Location: [facility];  Service:  Orthopedics;  Laterality: Left;  . INCISION AND DRAINAGE Left 06/11/2017   Procedure: INCISION AND DRAINAGE POSTOPERATIVE WOUND LEFT HEEL;  Surgeon: Sheral Apley, MD;  Location: [facility];  Service: Orthopedics;  Laterality: Left;  . PLANTAR FASCIECTOMY  2012  . 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 Abdominal pain   . Acid reflux    BP (!) 152/84   Pulse 82   Temp 98 F (36.7 C)   Ht 6' (1.829 m)   Wt (!) 319 lb (144.7 kg)   SpO2 98%   BMI  43.26 kg/m   Opioid Risk Score:   Fall Risk Score:  `1  Depression screen PHQ 2/9  Depression screen [facility] 2/9 11/10/2020 10/24/2020  Decreased Interest 0 0  Down, Depressed, Hopeless 0 0  PHQ - 2 Score 0 0  Altered sleeping - 0  Tired, decreased energy - 1  Change in appetite - 0  Feeling bad or failure about yourself  - 0  Trouble concentrating - 0  Moving slowly or fidgety/restless - 0  Suicidal thoughts - 0  PHQ-9 Score - 1   Review of Systems  Constitutional: Negative.   HENT: Negative.   Eyes: Negative.   Respiratory: Negative.   Cardiovascular: Negative.   Gastrointestinal: Negative.   Endocrine: Negative.   Genitourinary: Negative.   Musculoskeletal:       Slight pain on right side   Skin: Negative.   Allergic/Immunologic: Negative.   Neurological: Negative for headaches.  Hematological: Negative.   Psychiatric/Behavioral: Negative.   All other systems reviewed and are negative.      Objective:   Physical Exam  Constitutional: No distress . Vital signs reviewed. HENT: Normocephalic.  Atraumatic. Eyes: EOMI. No discharge. Cardiovascular: No JVD.   Respiratory: Normal effort.  No stridor.   GI: Non-distended.   Skin: Warm and dry.  Intact. Psych: Normal mood.  Normal behavior. Musc: No edema in extremities.  No tenderness in extremities. Neuro: Alert Motor: 5/5 throughout Attention/Concentration: intact Recall: 5/5 after 5 min    Assessment & Plan:  Male with past medical history/past surgical history of GERD, left thumb surgery, plantar fasciotomy, left Achilles tendon repair presents with postconcussive syndrome.   1. Post concussive syndrome - appears to have resolved  Main deficits - Dizziness, headaches, nausea, confusion- all resolved.    CT head unremarkable  Chart information reviewed - no documentation of seizures based on what patient provided in EMS report  Completed Vestibular PT with benefit  Continue Tylenol  Does not appear to have had  seizures after review of Neurology notes as well as history, neg EEG, released by Neuro  FMLA paperwork filled out again  Will allow patient to RTW without restrictions   2. Morbid Obesity  Encouraged weight loss again

## 2020-12-05 ENCOUNTER — Ambulatory Visit: Payer: PRIVATE HEALTH INSURANCE | Admitting: Physical Medicine & Rehabilitation

## 2021-08-05 ENCOUNTER — Telehealth: Payer: PRIVATE HEALTH INSURANCE | Admitting: Physician Assistant

## 2021-08-05 DIAGNOSIS — J019 Acute sinusitis, unspecified: Secondary | ICD-10-CM | POA: Diagnosis not present

## 2021-08-05 DIAGNOSIS — B9689 Other specified bacterial agents as the cause of diseases classified elsewhere: Secondary | ICD-10-CM

## 2021-08-05 MED ORDER — DOXYCYCLINE HYCLATE 100 MG PO TABS: 100.0000 mg | ORAL_TABLET | Freq: Two times a day (BID) | ORAL | 0 refills | Status: AC

## 2021-08-05 MED ORDER — FLUTICASONE PROPIONATE 50 MCG/ACT NA SUSP: 2.0000 | Freq: Every day | NASAL | 0 refills | Status: AC

## 2021-08-05 NOTE — Patient Instructions (Signed)
Delford Field, thank you for joining Piedad Climes, PA-C for today's virtual visit.  While this provider is not your primary care provider (PCP), if your PCP is located in our provider database this encounter information will be shared with them immediately following your visit.  Consent: (Patient) Jeffrey Padilla provided verbal consent for this virtual visit at the beginning of the encounter.  Current Medications:  Current Outpatient Medications:    acetaminophen (TYLENOL) 325 MG tablet, Take 650 mg by mouth every 6 (six) hours as needed., Disp: , Rfl:    celecoxib (CELEBREX) 100 MG capsule, Take 100 mg by mouth 2 (two) times daily., Disp: , Rfl:    escitalopram (LEXAPRO) 10 MG tablet, Take 10 mg by mouth at bedtime., Disp: , Rfl:    esomeprazole (NEXIUM) 20 MG capsule, Take 20 mg by mouth daily before breakfast., Disp: , Rfl:    Multiple Vitamins-Minerals (ONE-A-DAY MENS HEALTH FORMULA PO), Take 1 tablet by mouth daily with breakfast., Disp: , Rfl:    Medications ordered in this encounter:  No orders of the defined types were placed in this encounter.    *If you need refills on other medications prior to your next appointment, please contact your pharmacy*  Follow-Up: Call back or seek an in-person evaluation if the symptoms worsen or if the condition fails to improve as anticipated.  Other Instructions Please take antibiotic as directed.  Increase fluid intake.  Use Saline nasal spray.  Take a daily multivitamin. Use the Flonase as directed.  Place a humidifier in the bedroom.  Please call or return clinic if symptoms are not improving.  Sinusitis Sinusitis is redness, soreness, and swelling (inflammation) of the paranasal sinuses. Paranasal sinuses are air pockets within the bones of your face (beneath the eyes, the middle of the forehead, or above the eyes). In healthy paranasal sinuses, mucus is able to drain out, and air is able to circulate through them by way of your  nose. However, when your paranasal sinuses are inflamed, mucus and air can become trapped. This can allow bacteria and other germs to grow and cause infection. Sinusitis can develop quickly and last only a short time (acute) or continue over a long period (chronic). Sinusitis that lasts for more than 12 weeks is considered chronic.  CAUSES  Causes of sinusitis include: Allergies. Structural abnormalities, such as displacement of the cartilage that separates your nostrils (deviated septum), which can decrease the air flow through your nose and sinuses and affect sinus drainage. Functional abnormalities, such as when the small hairs (cilia) that line your sinuses and help remove mucus do not work properly or are not present. SYMPTOMS  Symptoms of acute and chronic sinusitis are the same. The primary symptoms are pain and pressure around the affected sinuses. Other symptoms include: Upper toothache. Earache. Headache. Bad breath. Decreased sense of smell and taste. A cough, which worsens when you are lying flat. Fatigue. Fever. Thick drainage from your nose, which often is green and may contain pus (purulent). Swelling and warmth over the affected sinuses. DIAGNOSIS  Your caregiver will perform a physical exam. During the exam, your caregiver may: Look in your nose for signs of abnormal growths in your nostrils (nasal polyps). Tap over the affected sinus to check for signs of infection. View the inside of your sinuses (endoscopy) with a special imaging device with a light attached (endoscope), which is inserted into your sinuses. If your caregiver suspects that you have chronic sinusitis, one or more of  the following tests may be recommended: Allergy tests. Nasal culture A sample of mucus is taken from your nose and sent to a lab and screened for bacteria. Nasal cytology A sample of mucus is taken from your nose and examined by your caregiver to determine if your sinusitis is related to an  allergy. TREATMENT  Most cases of acute sinusitis are related to a viral infection and will resolve on their own within 10 days. Sometimes medicines are prescribed to help relieve symptoms (pain medicine, decongestants, nasal steroid sprays, or saline sprays).  However, for sinusitis related to a bacterial infection, your caregiver will prescribe antibiotic medicines. These are medicines that will help kill the bacteria causing the infection.  Rarely, sinusitis is caused by a fungal infection. In theses cases, your caregiver will prescribe antifungal medicine. For some cases of chronic sinusitis, surgery is needed. Generally, these are cases in which sinusitis recurs more than 3 times per year, despite other treatments. HOME CARE INSTRUCTIONS  Drink plenty of water. Water helps thin the mucus so your sinuses can drain more easily. Use a humidifier. Inhale steam 3 to 4 times a day (for example, sit in the bathroom with the shower running). Apply a warm, moist washcloth to your face 3 to 4 times a day, or as directed by your caregiver. Use saline nasal sprays to help moisten and clean your sinuses. Take over-the-counter or prescription medicines for pain, discomfort, or fever only as directed by your caregiver. SEEK IMMEDIATE MEDICAL CARE IF: You have increasing pain or severe headaches. You have nausea, vomiting, or drowsiness. You have swelling around your face. You have vision problems. You have a stiff neck. You have difficulty breathing. MAKE SURE YOU:  Understand these instructions. Will watch your condition. Will get help right away if you are not doing well or get worse. Document Released: 07/23/2005 Document Revised: 10/15/2011 Document Reviewed: 08/07/2011 [facility] Patient Information 2014 [place], [place].    If you have been instructed to have an in-person evaluation today at a local Urgent Care facility, please use the link below. It will take you to a list of all of our  available [facility], including address, phone number and hours of operation. Please do not delay care.  [facility]  If you or a family member do not have a primary care provider, use the link below to schedule a visit and establish care. When you choose a [facility] primary care physician or advanced practice provider, you gain a long-term partner in health. Find a Primary Care Provider  Learn more about [facility] in-office and virtual care options: [facility] - Get Care Now

## 2021-08-05 NOTE — Progress Notes (Signed)
Virtual Visit Consent   Jeffrey Padilla, you are scheduled for a virtual visit with a [facility] provider today.     Just as with appointments in the office, your consent must be obtained to participate.  Your consent will be active for this visit and any virtual visit you may have with one of our providers in the next 365 days.     If you have a MyChart account, a copy of this consent can be sent to you electronically.  All virtual visits are billed to your insurance company just like a traditional visit in the office.    As this is a virtual visit, video technology does not allow for your provider to perform a traditional examination.  This may limit your provider's ability to fully assess your condition.  If your provider identifies any concerns that need to be evaluated in person or the need to arrange testing (such as labs, EKG, etc.), we will make arrangements to do so.     Although advances in technology are sophisticated, we cannot ensure that it will always work on either your end or our end.  If the connection with a video visit is poor, the visit may have to be switched to a telephone visit.  With either a video or telephone visit, we are not always able to ensure that we have a secure connection.     I need to obtain your verbal consent now.   Are you willing to proceed with your visit today?    Jeffrey Padilla has provided verbal consent on 08/05/2021 for a virtual visit (video or telephone).   Piedad Climes, [place]   Date: 08/05/2021 11:08 AM   Virtual Visit via Video Note   I, Piedad Climes, connected with  Jeffrey Padilla  ([id number], [birth year]) on 08/05/21 at 11:00 AM EST by a video-enabled telemedicine application and verified that I am speaking with the correct person using two identifiers.  Location: Patient: Virtual Visit Location Patient: Home Provider: Virtual Visit Location Provider: Home Office   I discussed the limitations of evaluation and  management by telemedicine and the availability of in person appointments. The patient expressed understanding and agreed to proceed.    History of Present Illness: Jeffrey Padilla is a 50 y.o. who identifies as a male who was assigned male at birth, and is being seen today for possible sinusitis. Notes sinus symptoms starting over the holiday with nasal congestion, sinus pressure, headache and fever. Notes symptoms have continued to progress since that time, still with intermittent fever but now facial pain and maxillary sinus/tooth pain. Denies any chest pain or SOB. Has taken home COVID tests which were negative. Denies any travel or known sick contact.   HPI: HPI  Problems:  Patient Active Problem List   Diagnosis Date Noted   Morbid obesity (HCC) 10/24/2020   Acute post-traumatic headache, not intractable 10/24/2020   Multiple rib fractures 09/05/2020   Intractable headache 05/22/2018   Head injuries, initial encounter 05/22/2018   Post concussion syndrome 05/22/2018   Behavioral change 05/22/2018   Neck pain 05/22/2018   Left foot infection 06/11/2017    Allergies:  Allergies  Allergen Reactions   Morphine And Related Anaphylaxis    "stops my heart"    Morphine And Related Other (See Comments)    Unconsciousness    Morphine     Other reaction(s): Other (See Comments) Stops heart   Penicillins Rash    As a child  reash   Penicillins Rash   Tramadol Itching and Rash    itch   Medications:  Current Outpatient Medications:    doxycycline (VIBRA-TABS) 100 MG tablet, Take 1 tablet (100 mg total) by mouth 2 (two) times daily., Disp: 20 tablet, Rfl: 0   fluticasone (FLONASE) 50 MCG/ACT nasal spray, Place 2 sprays into both nostrils daily., Disp: 16 g, Rfl: 0   acetaminophen (TYLENOL) 325 MG tablet, Take 650 mg by mouth every 6 (six) hours as needed., Disp: , Rfl:    celecoxib (CELEBREX) 100 MG capsule, Take 100 mg by mouth 2 (two) times daily., Disp: , Rfl:    escitalopram  (LEXAPRO) 10 MG tablet, Take 10 mg by mouth at bedtime., Disp: , Rfl:    esomeprazole (NEXIUM) 20 MG capsule, Take 20 mg by mouth daily before breakfast., Disp: , Rfl:    Multiple Vitamins-Minerals (ONE-A-DAY MENS HEALTH FORMULA PO), Take 1 tablet by mouth daily with breakfast., Disp: , Rfl:   Observations/Objective: Patient is well-developed, well-nourished in no acute distress.  Resting comfortably at home.  Head is normocephalic, atraumatic.  No labored breathing. Speech is clear and coherent with logical content.  Patient is alert and oriented at baseline.   Assessment and Plan: 1. Acute bacterial sinusitis - fluticasone (FLONASE) 50 MCG/ACT nasal spray; Place 2 sprays into both nostrils daily.  Dispense: 16 g; Refill: 0 - doxycycline (VIBRA-TABS) 100 MG tablet; Take 1 tablet (100 mg total) by mouth 2 (two) times daily.  Dispense: 20 tablet; Refill: 0  Rx Doxycycline.  Increase fluids.  Rest.  Saline nasal spray.  Probiotic.  Mucinex as directed.  Humidifier in bedroom. Flonase per order.  Call or return to clinic if symptoms are not improving.   Follow Up Instructions: I discussed the assessment and treatment plan with the patient. The patient was provided an opportunity to ask questions and all were answered. The patient agreed with the plan and demonstrated an understanding of the instructions.  A copy of instructions were sent to the patient via MyChart unless otherwise noted below.   The patient was advised to call back or seek an in-person evaluation if the symptoms worsen or if the condition fails to improve as anticipated.  Time:  I spent 14 minutes with the patient via telehealth technology discussing the above problems/concerns.    Piedad Climes, PA-C

## 2022-05-28 IMAGING — CT CT HEAD W/O CM
4 series · 16 of 47 positions shown, 18 images · non-contrast
Comparison: Head dated 03/24/2018.

CLINICAL DATA: 49-year-old male with fall.

EXAM:
CT HEAD WITHOUT CONTRAST
CT CERVICAL SPINE WITHOUT CONTRAST
TECHNIQUE: Multidetector CT imaging of the head and cervical spine was
performed following the standard protocol without intravenous
contrast. Multiplanar CT image reconstructions of the cervical spine
were also generated.

[Series 3: head wo · axial · 0.45mm/px · z∈[-256,-101]mm · 7 of 43 slices shown, 9 images]
[im 6/43  brain]
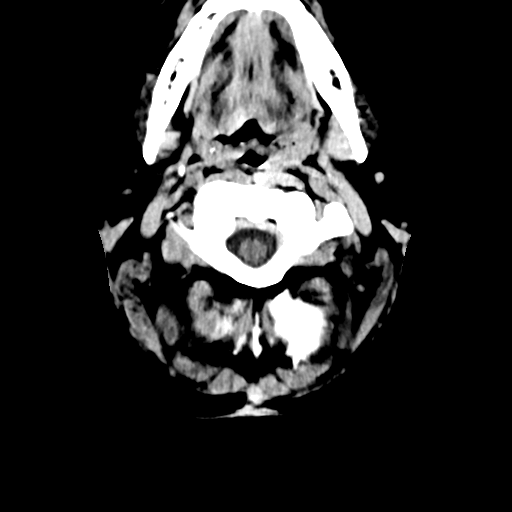
[im 6/43  bone]
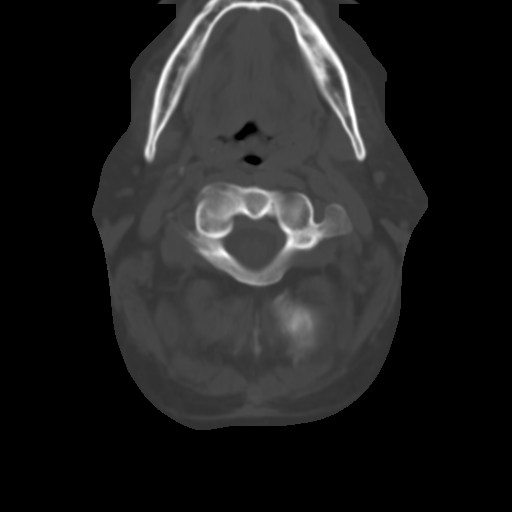
[im 11/43  brain]
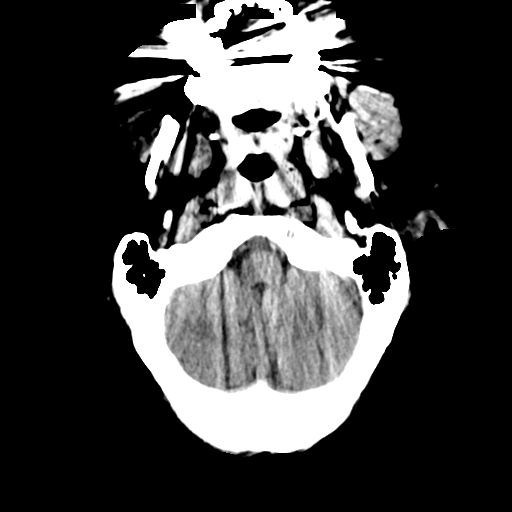
[im 16/43  brain]
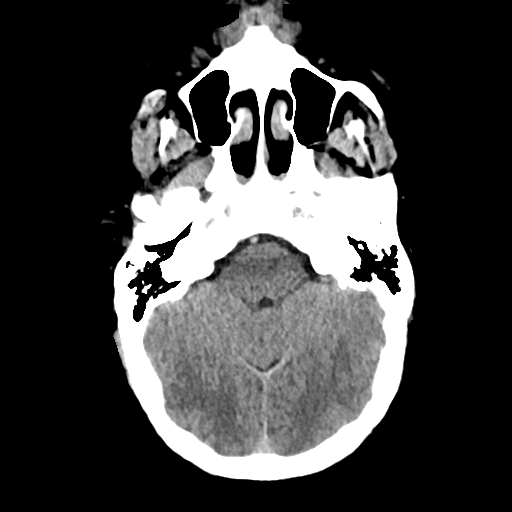
[im 22/43  brain]
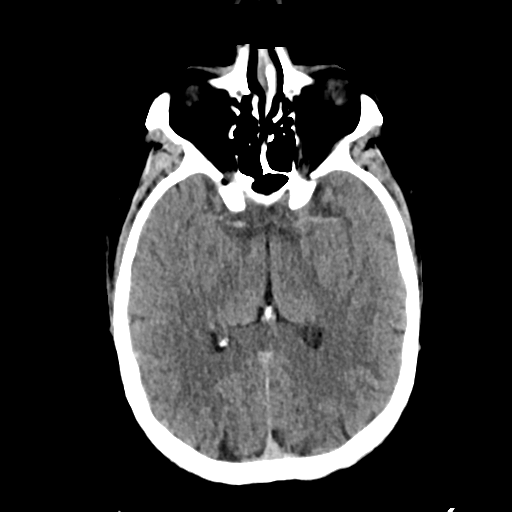
[im 27/43  brain]
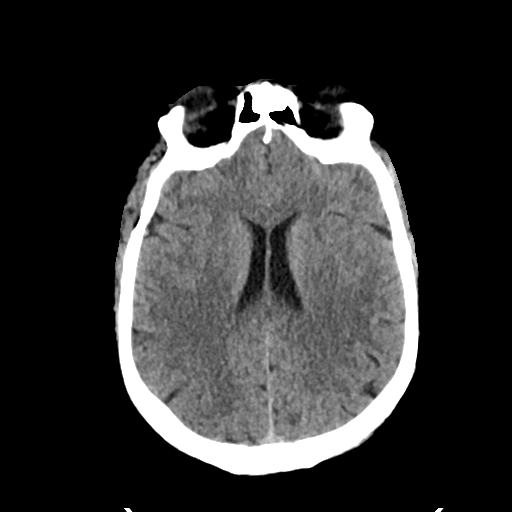
[im 27/43  bone]
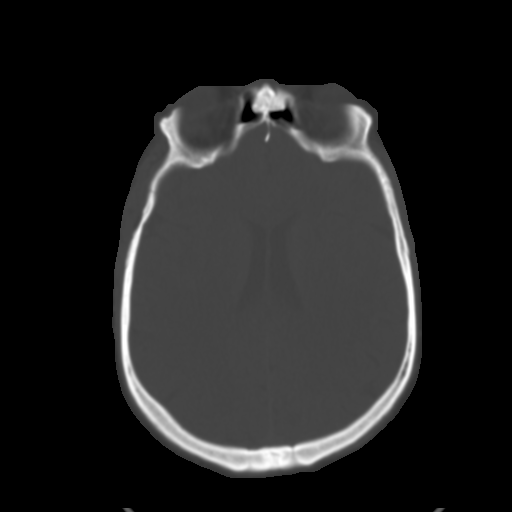
[im 32/43  brain]
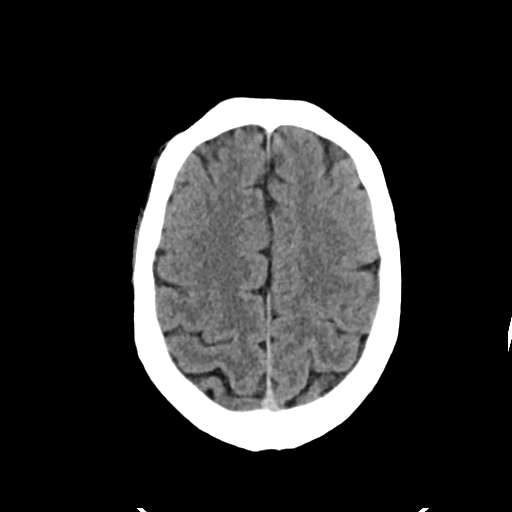
[im 37/43  brain]
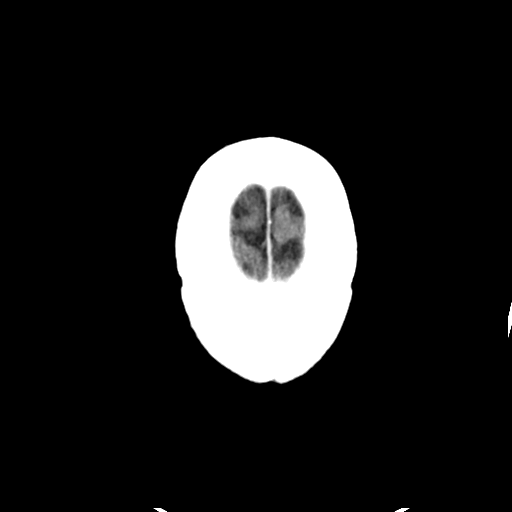

[Series 4: head bone · axial · 0.45mm/px · z∈[-261,-219]mm · 3 of 107 slices shown]
[im 11/107  bone]
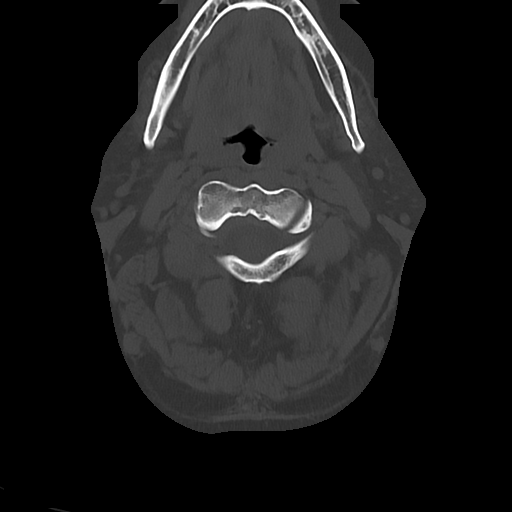
[im 22/107  bone]
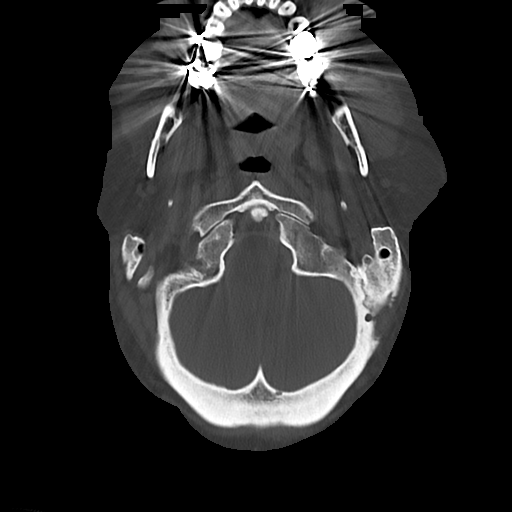
[im 32/107  bone]
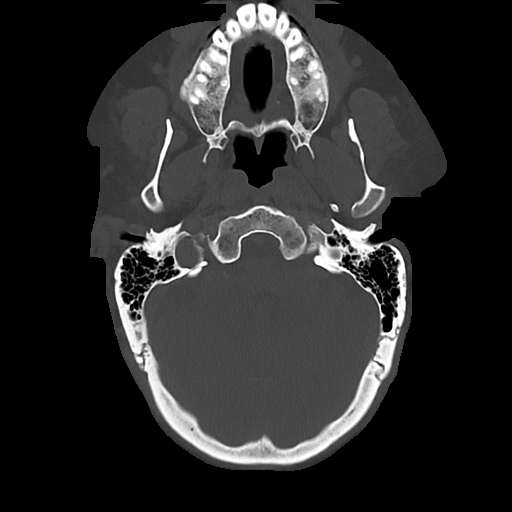

[Series 5: cor soft · coronal · 0.38mm/px · 3 of 83 slices shown]
[im 28/83  brain]
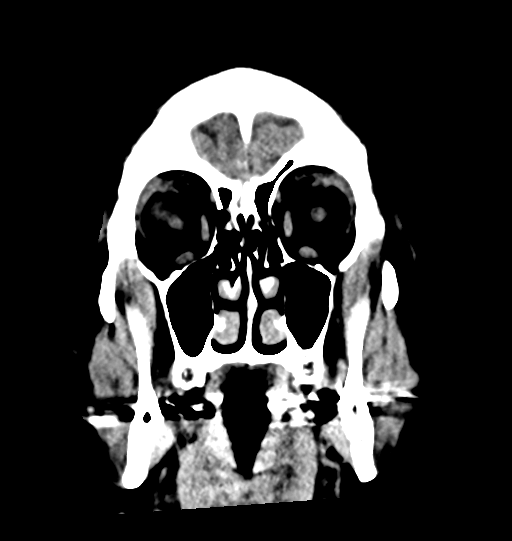
[im 37/83  brain]
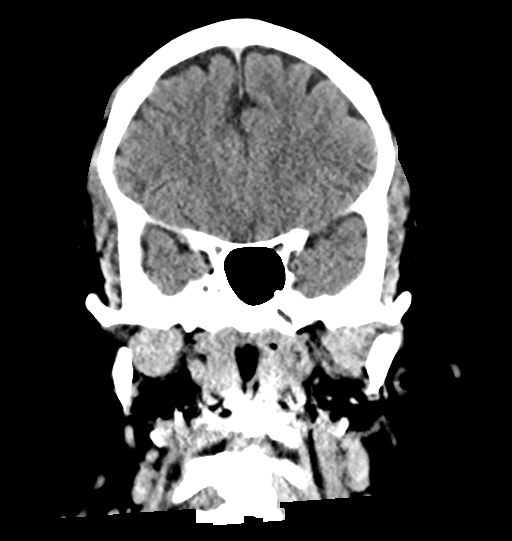
[im 46/83  brain]
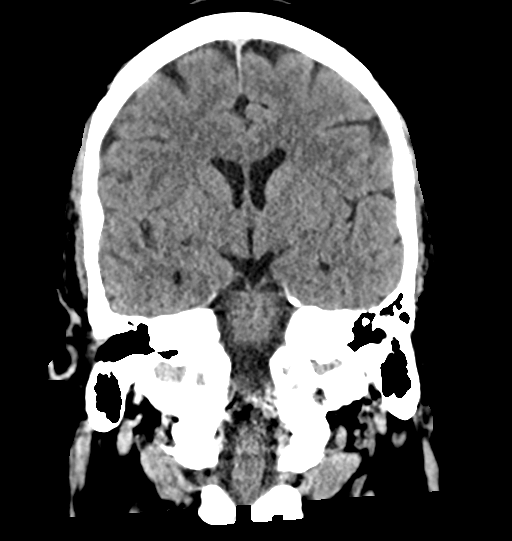

[Series 6: sag soft · sagittal · 0.40mm/px · 3 of 64 slices shown]
[im 22/64  brain]
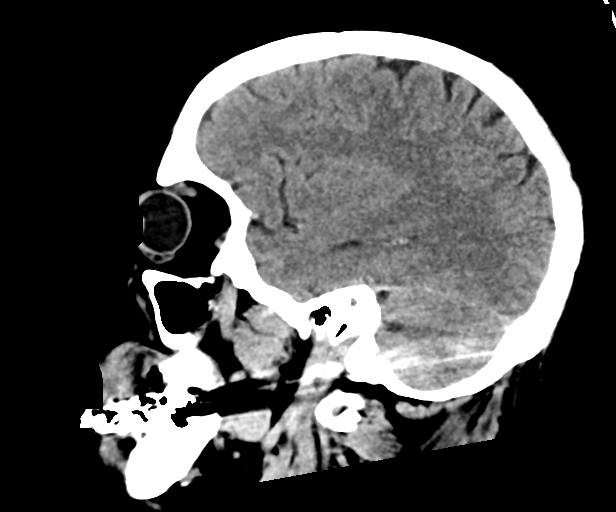
[im 32/64  brain]
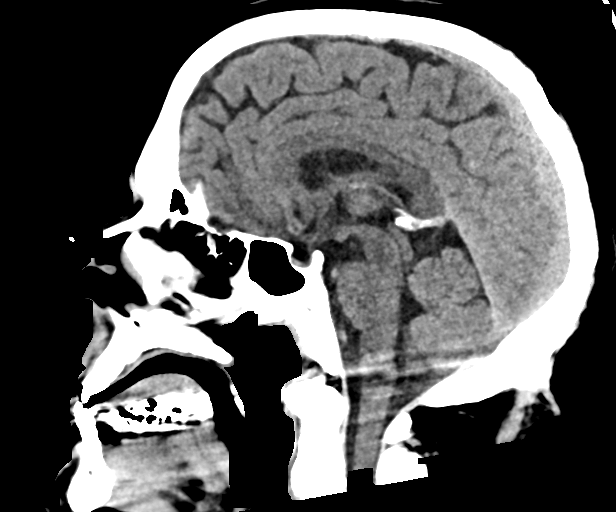
[im 43/64  brain]
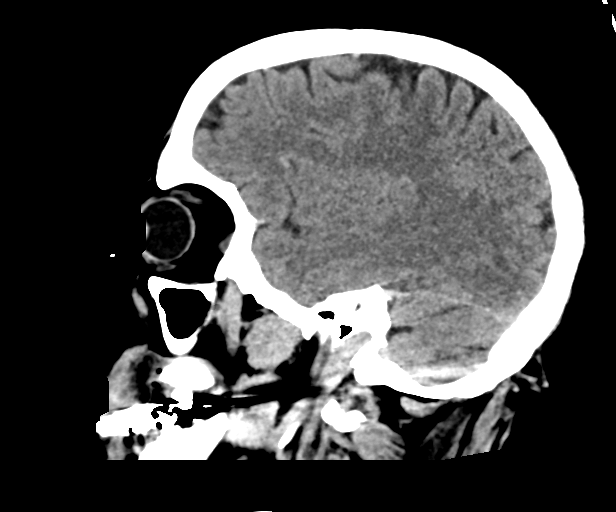

[16 of 47 positions shown; findings below may reference images not displayed]

FINDINGS: CT HEAD FINDINGS

Brain: The ventricles and sulci appropriate size for patient's age.
The gray-white matter discrimination is preserved. There is no acute
intracranial hemorrhage. No mass effect midline shift no extra-axial
fluid collection.

Vascular: No hyperdense vessel or unexpected calcification.

Skull: Normal. Negative for fracture or focal lesion.

Sinuses/Orbits: No acute finding.

Other: None

CT CERVICAL SPINE FINDINGS

Alignment:

Skull base and vertebrae: No acute fracture.

Soft tissues and spinal canal: No prevertebral fluid or swelling. No
visible canal hematoma.

Disc levels:  No acute findings.  Mild degenerative changes.

Upper chest: Negative.

Other: None
IMPRESSION: 1. No acute intracranial pathology.
2. No acute/traumatic cervical spine pathology.

## 2022-08-30 ENCOUNTER — Other Ambulatory Visit (HOSPITAL_COMMUNITY): Payer: Self-pay

## 2022-08-31 ENCOUNTER — Other Ambulatory Visit (HOSPITAL_COMMUNITY): Payer: Self-pay

## 2022-08-31 MED ORDER — SEMAGLUTIDE-WEIGHT MANAGEMENT 0.25 MG/0.5ML ~~LOC~~ SOAJ
0.2500 mg | SUBCUTANEOUS | 1 refills | Status: AC
  Filled 2022-08-31: qty 2, 28d supply, fill #0

## 2022-09-18 ENCOUNTER — Other Ambulatory Visit (HOSPITAL_COMMUNITY): Payer: Self-pay

## 2022-09-26 ENCOUNTER — Other Ambulatory Visit (HOSPITAL_COMMUNITY): Payer: Self-pay

## 2022-10-10 ENCOUNTER — Other Ambulatory Visit (HOSPITAL_COMMUNITY): Payer: Self-pay

## 2022-10-10 MED ORDER — WEGOVY 0.5 MG/0.5ML ~~LOC~~ SOAJ
0.5000 mg | SUBCUTANEOUS | 0 refills | Status: DC
  Filled 2022-10-10 (×2): qty 2, 28d supply, fill #0

## 2022-10-11 ENCOUNTER — Other Ambulatory Visit (HOSPITAL_COMMUNITY): Payer: Self-pay

## 2022-10-23 ENCOUNTER — Other Ambulatory Visit (HOSPITAL_COMMUNITY): Payer: Self-pay
